# Patient Record
Sex: Male | Born: 1950 | Race: White | Hispanic: No | Marital: Married | Smoking: Former smoker
Health system: Southern US, Community
[De-identification: ages and names within clinical notes are randomized; demographics above are authoritative.]

## PROBLEM LIST (undated history)

## (undated) DIAGNOSIS — M549 Dorsalgia, unspecified: Secondary | ICD-10-CM

## (undated) DIAGNOSIS — I10 Essential (primary) hypertension: Secondary | ICD-10-CM

## (undated) DIAGNOSIS — R7303 Prediabetes: Secondary | ICD-10-CM

## (undated) DIAGNOSIS — C61 Malignant neoplasm of prostate: Secondary | ICD-10-CM

## (undated) DIAGNOSIS — E7800 Pure hypercholesterolemia, unspecified: Secondary | ICD-10-CM

## (undated) DIAGNOSIS — M1990 Unspecified osteoarthritis, unspecified site: Secondary | ICD-10-CM

## (undated) DIAGNOSIS — I6529 Occlusion and stenosis of unspecified carotid artery: Secondary | ICD-10-CM

## (undated) DIAGNOSIS — Z9889 Other specified postprocedural states: Secondary | ICD-10-CM

## (undated) DIAGNOSIS — M79601 Pain in right arm: Secondary | ICD-10-CM

## (undated) DIAGNOSIS — R112 Nausea with vomiting, unspecified: Secondary | ICD-10-CM

## (undated) DIAGNOSIS — H35719 Central serous chorioretinopathy, unspecified eye: Secondary | ICD-10-CM

## (undated) DIAGNOSIS — F320 Major depressive disorder, single episode, mild: Secondary | ICD-10-CM

## (undated) DIAGNOSIS — M542 Cervicalgia: Secondary | ICD-10-CM

## (undated) DIAGNOSIS — F32A Depression, unspecified: Secondary | ICD-10-CM

## (undated) HISTORY — DX: Cervicalgia: M54.2

## (undated) HISTORY — DX: Dorsalgia, unspecified: M54.9

## (undated) HISTORY — DX: Essential (primary) hypertension: I10

## (undated) HISTORY — DX: Pure hypercholesterolemia, unspecified: E78.00

## (undated) HISTORY — PX: PROSTATE SURGERY: SHX751

## (undated) HISTORY — DX: Depression, unspecified: F32.A

## (undated) HISTORY — DX: Pain in right arm: M79.601

## (undated) HISTORY — DX: Malignant neoplasm of prostate: C61

## (undated) HISTORY — DX: Occlusion and stenosis of unspecified carotid artery: I65.29

## (undated) HISTORY — DX: Major depressive disorder, single episode, mild: F32.0

---

## 1973-03-30 HISTORY — PX: FRACTURE SURGERY: SHX138

## 1997-10-11 ENCOUNTER — Observation Stay (HOSPITAL_COMMUNITY): Admission: RE | Admit: 1997-10-11 | Discharge: 1997-10-12 | Payer: Self-pay | Admitting: Specialist

## 2000-03-30 HISTORY — PX: HIP SURGERY: SHX245

## 2003-12-24 ENCOUNTER — Ambulatory Visit (HOSPITAL_COMMUNITY): Admission: RE | Admit: 2003-12-24 | Discharge: 2003-12-24 | Payer: Self-pay | Admitting: Gastroenterology

## 2012-03-30 HISTORY — PX: NECK SURGERY: SHX720

## 2012-08-01 ENCOUNTER — Ambulatory Visit (INDEPENDENT_AMBULATORY_CARE_PROVIDER_SITE_OTHER): Payer: BC Managed Care – PPO | Admitting: Neurology

## 2012-08-01 ENCOUNTER — Encounter: Payer: Self-pay | Admitting: Neurology

## 2012-08-01 VITALS — BP 154/90 | HR 70 | Ht 72.0 in | Wt 188.0 lb

## 2012-08-01 DIAGNOSIS — R29898 Other symptoms and signs involving the musculoskeletal system: Secondary | ICD-10-CM

## 2012-08-01 DIAGNOSIS — M549 Dorsalgia, unspecified: Secondary | ICD-10-CM

## 2012-08-01 DIAGNOSIS — M79609 Pain in unspecified limb: Secondary | ICD-10-CM

## 2012-08-01 DIAGNOSIS — F320 Major depressive disorder, single episode, mild: Secondary | ICD-10-CM | POA: Insufficient documentation

## 2012-08-01 DIAGNOSIS — M542 Cervicalgia: Secondary | ICD-10-CM | POA: Insufficient documentation

## 2012-08-01 DIAGNOSIS — C61 Malignant neoplasm of prostate: Secondary | ICD-10-CM

## 2012-08-01 DIAGNOSIS — M79601 Pain in right arm: Secondary | ICD-10-CM
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
## 2012-08-02 ENCOUNTER — Encounter: Payer: Self-pay | Admitting: Neurology

## 2012-08-29 ENCOUNTER — Encounter (INDEPENDENT_AMBULATORY_CARE_PROVIDER_SITE_OTHER): Payer: BC Managed Care – PPO

## 2012-08-29 ENCOUNTER — Telehealth: Payer: Self-pay | Admitting: Neurology

## 2012-08-29 ENCOUNTER — Ambulatory Visit (INDEPENDENT_AMBULATORY_CARE_PROVIDER_SITE_OTHER): Payer: BC Managed Care – PPO | Admitting: Neurology

## 2012-08-29 DIAGNOSIS — M5412 Radiculopathy, cervical region: Secondary | ICD-10-CM

## 2012-08-29 DIAGNOSIS — M549 Dorsalgia, unspecified: Secondary | ICD-10-CM

## 2012-08-29 DIAGNOSIS — M542 Cervicalgia: Secondary | ICD-10-CM

## 2012-08-29 DIAGNOSIS — M79609 Pain in unspecified limb: Secondary | ICD-10-CM

## 2012-08-29 DIAGNOSIS — R29898 Other symptoms and signs involving the musculoskeletal system: Secondary | ICD-10-CM

## 2012-08-29 DIAGNOSIS — Z0289 Encounter for other administrative examinations: Secondary | ICD-10-CM

## 2012-08-29 DIAGNOSIS — C61 Malignant neoplasm of prostate: Secondary | ICD-10-CM

## 2012-08-29 DIAGNOSIS — F320 Major depressive disorder, single episode, mild: Secondary | ICD-10-CM

## 2012-08-29 DIAGNOSIS — M79601 Pain in right arm: Secondary | ICD-10-CM

## 2012-08-29 NOTE — Procedures (Signed)
History of present illness: 62 years old right-handed Caucasian male, with two-month history of neck pain, radiating pain to his right shoulder, right lateral arm, noticed muscle atrophy of right triceps muscle, and shoulder muscles,  On examination: he has mild right shoulder extension weakness, there was noticeable muscle atrophy of right triceps, mild right shoulder winging, deep tendon reflexes were present and symmetric  Nerve conduction study: Bilateral median, ulnar sensory and motor responses were normal  Electromyography: Selected needle examination was performed at right upper extremity muscles, right cervical paraspinal muscles  Right triceps, increased insertion activity, 2 plus spontaneous activity, complex enlarged motor unit potential with decreased recruitment patterns  Right pronator teres: increases insertion activity, 2 plus spontaneous activity, complex enlarged motor unit potential with decreased recruitment patterns  Right biceps, normally insertion activity, no spontaneous activity, normal unit potential, with normal recruitment patterns  Right deltoid, supraspinatus: normal insertion activity, no spontaneous activity, normal unit potential, with normal recruitment patterns  Right teres major: normal insertion activity, no spontaneous activity, normal unit potential, with normal recruitment patterns  There was poor relaxation at right cervical paraspinal muscles, there was no  spontaneous activity.  In conclusion:   This is an abnormal study, there is electrodiagnostic evidence of active denervation involving right C6, C7 myotomes, suggestive of an active C6,7 cervical radiculopathy.

## 2012-08-29 NOTE — Telephone Encounter (Signed)
Called patient and spoke to him. Patient him sch. With Dr.Yan on 09/12/2012

## 2012-09-01 ENCOUNTER — Ambulatory Visit (INDEPENDENT_AMBULATORY_CARE_PROVIDER_SITE_OTHER): Payer: BC Managed Care – PPO

## 2012-09-01 DIAGNOSIS — R29898 Other symptoms and signs involving the musculoskeletal system: Secondary | ICD-10-CM

## 2012-09-01 DIAGNOSIS — M79609 Pain in unspecified limb: Secondary | ICD-10-CM

## 2012-09-01 DIAGNOSIS — C61 Malignant neoplasm of prostate: Secondary | ICD-10-CM

## 2012-09-01 DIAGNOSIS — F320 Major depressive disorder, single episode, mild: Secondary | ICD-10-CM

## 2012-09-01 DIAGNOSIS — M549 Dorsalgia, unspecified: Secondary | ICD-10-CM

## 2012-09-01 DIAGNOSIS — M542 Cervicalgia: Secondary | ICD-10-CM

## 2012-09-01 DIAGNOSIS — M79601 Pain in right arm: Secondary | ICD-10-CM

## 2012-09-05 NOTE — Progress Notes (Signed)
Quick Note:  I have called him and his wife, abnormal MR scan of the cervical spine showing prominent spondylitic changes from C3-C6 with disc osteophyte protrusion laterally at C4-5 and C5-6 likely involving the right C5 and C6 nerve roots. ______

## 2012-09-12 ENCOUNTER — Ambulatory Visit (INDEPENDENT_AMBULATORY_CARE_PROVIDER_SITE_OTHER): Payer: BC Managed Care – PPO | Admitting: Neurology

## 2012-09-12 ENCOUNTER — Encounter: Payer: Self-pay | Admitting: Neurology

## 2012-09-12 VITALS — BP 133/81 | HR 71 | Ht 70.0 in | Wt 187.0 lb

## 2012-09-12 DIAGNOSIS — M79601 Pain in right arm: Secondary | ICD-10-CM

## 2012-09-12 DIAGNOSIS — R29898 Other symptoms and signs involving the musculoskeletal system: Secondary | ICD-10-CM

## 2012-09-12 DIAGNOSIS — M79609 Pain in unspecified limb: Secondary | ICD-10-CM

## 2012-09-12 DIAGNOSIS — M5010 Cervical disc disorder with radiculopathy, unspecified cervical region: Secondary | ICD-10-CM | POA: Insufficient documentation

## 2012-09-12 DIAGNOSIS — M5412 Radiculopathy, cervical region: Secondary | ICD-10-CM
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
## 2012-09-18 ENCOUNTER — Encounter: Payer: Self-pay | Admitting: Neurology

## 2012-09-22 NOTE — Telephone Encounter (Signed)
A referral has been made to Dr. Verlee Rossetti office. They will contact you with an appointment. Thank you. Su Ley BS RN

## 2013-01-02 ENCOUNTER — Ambulatory Visit: Payer: BC Managed Care – PPO | Admitting: Neurology

## 2013-02-02 ENCOUNTER — Other Ambulatory Visit: Payer: Self-pay

## 2016-12-31 ENCOUNTER — Other Ambulatory Visit: Payer: Self-pay | Admitting: Family Medicine

## 2016-12-31 DIAGNOSIS — R1011 Right upper quadrant pain: Secondary | ICD-10-CM

## 2017-01-06 ENCOUNTER — Ambulatory Visit
Admission: RE | Admit: 2017-01-06 | Discharge: 2017-01-06 | Disposition: A | Discharge status: Home / Self Care | Payer: Self-pay | Source: Ambulatory Visit | Attending: Family Medicine | Admitting: Family Medicine

## 2017-01-06 DIAGNOSIS — R1011 Right upper quadrant pain: Secondary | ICD-10-CM

## 2018-11-09 IMAGING — US US ABDOMEN LIMITED
1 series · 14 of 25 positions shown · non-contrast
Comparison: None.

CLINICAL DATA: Abdominal pain for 1 week

EXAM:
ULTRASOUND ABDOMEN LIMITED RIGHT UPPER QUADRANT

[Series 1: us abdomen limited · 0.18mm/px · 14 of 44 slices shown]
[im 1/44]
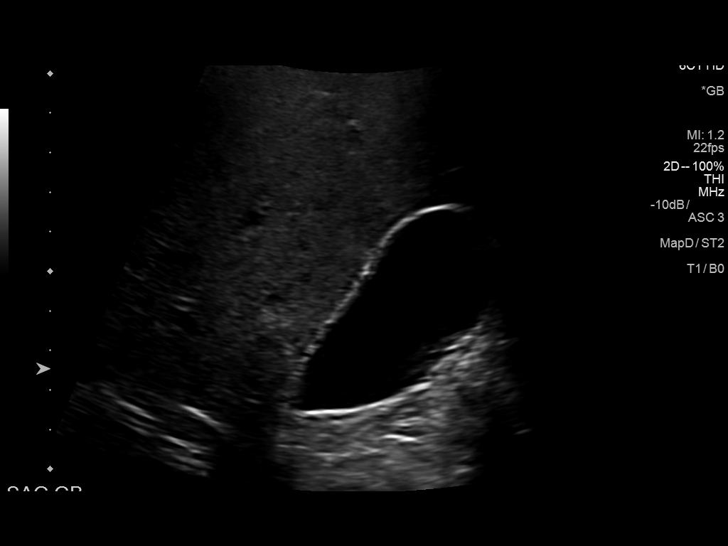
[im 4/44]
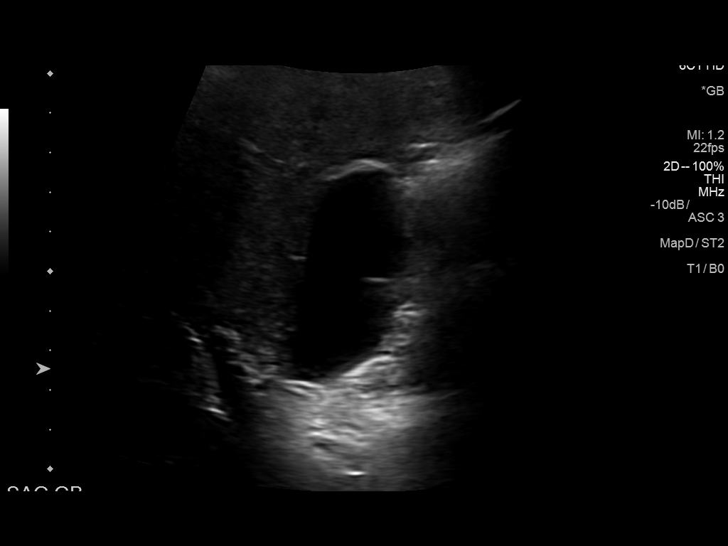
[im 8/44]
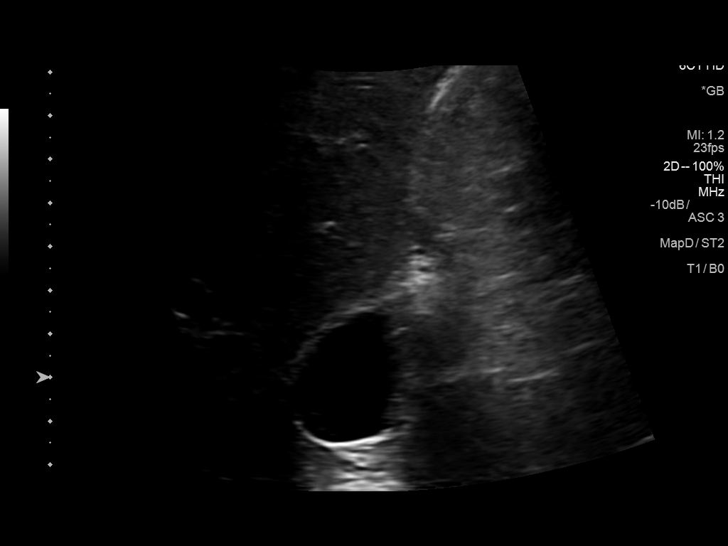
[im 11/44]
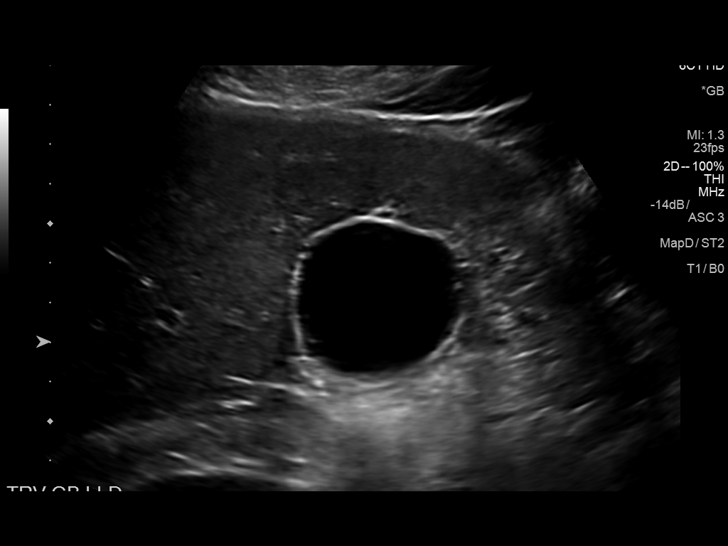
[im 15/44]
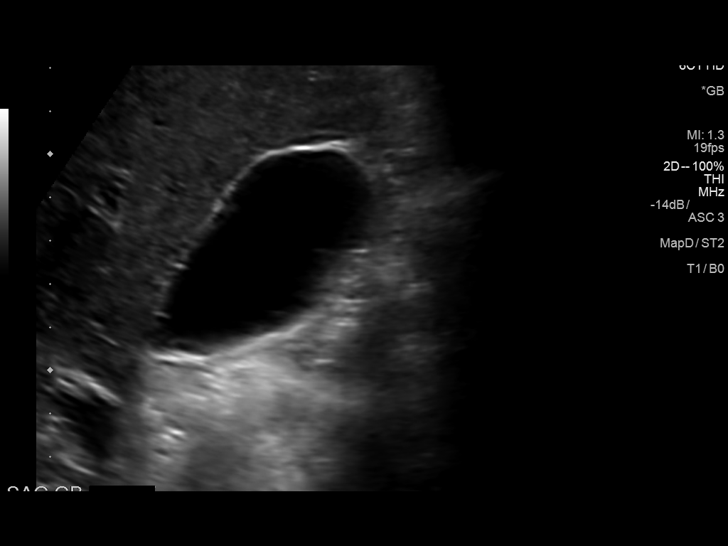
[im 17/44]
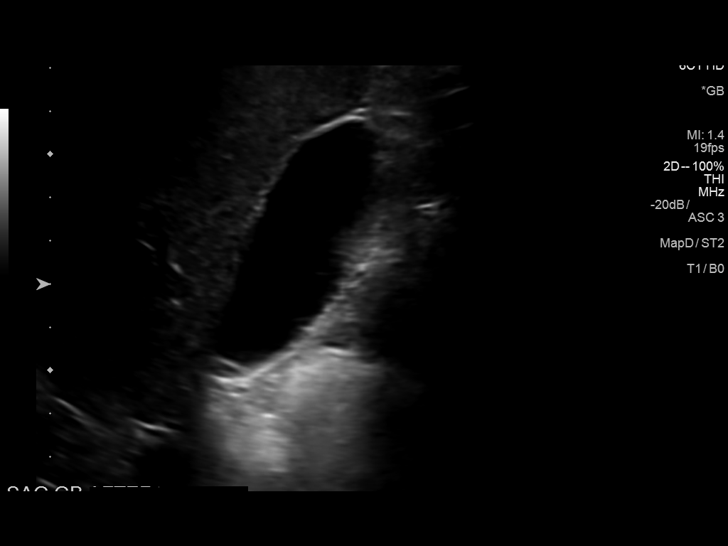
[im 20/44]
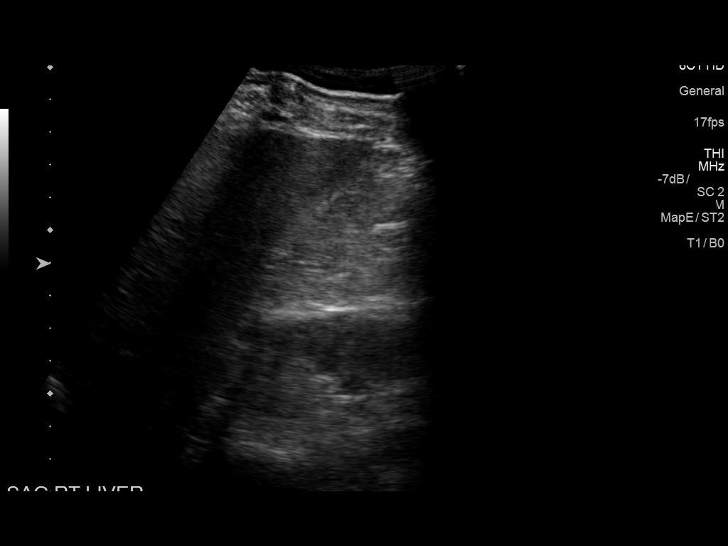
[im 24/44]
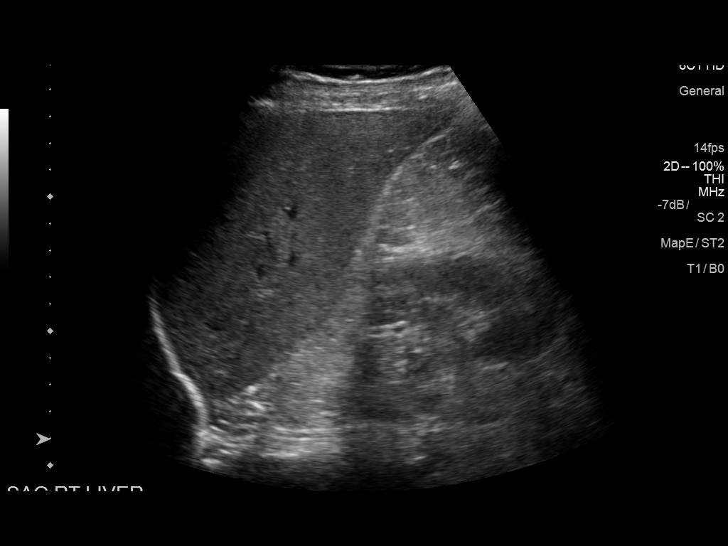
[im 27/44]
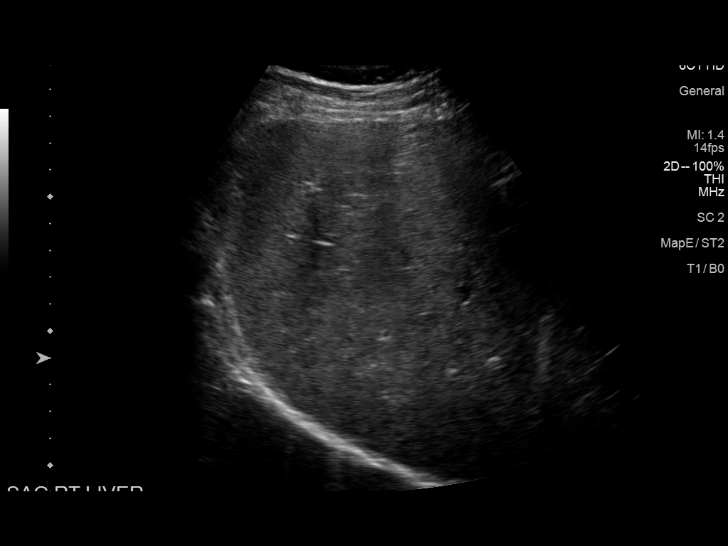
[im 29/44]
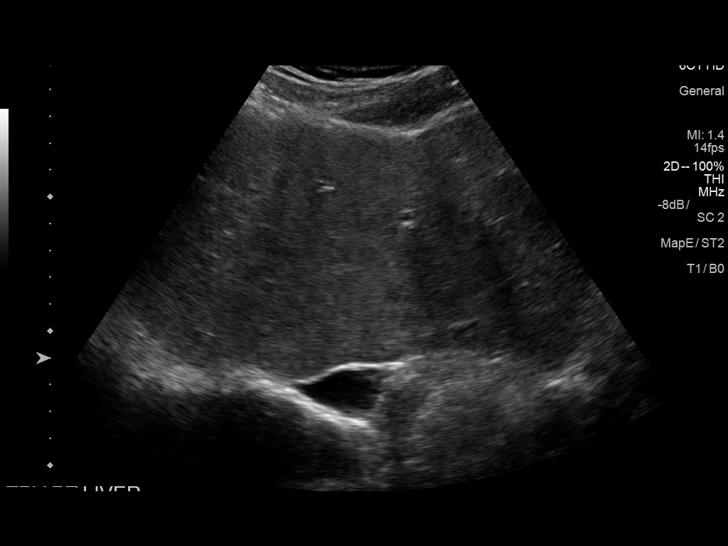
[im 33/44]
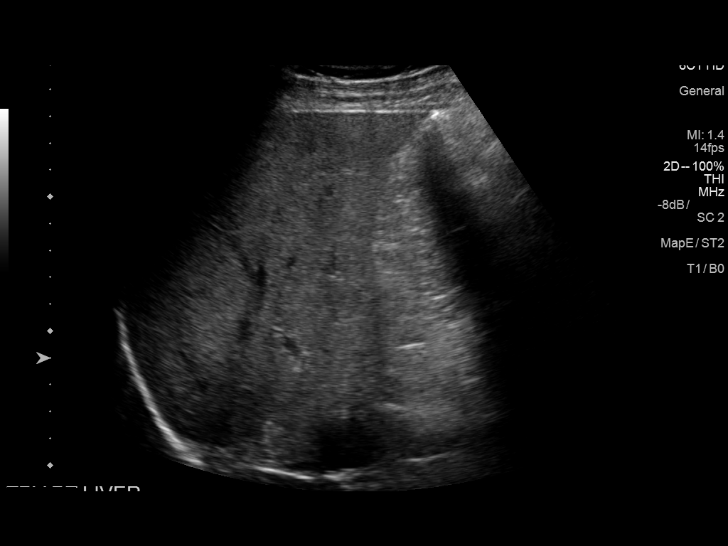
[im 36/44]
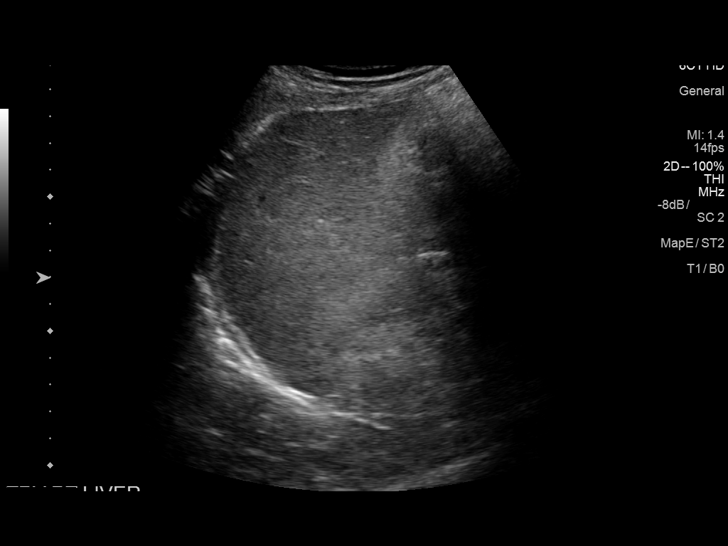
[im 40/44]
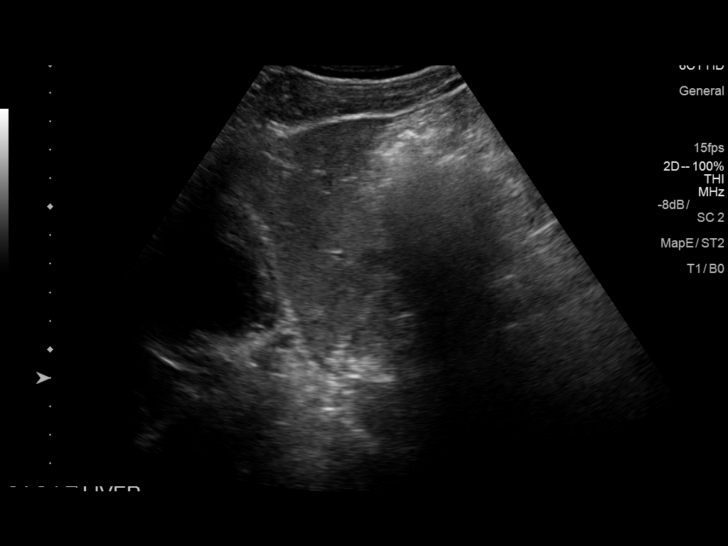
[im 44/44]
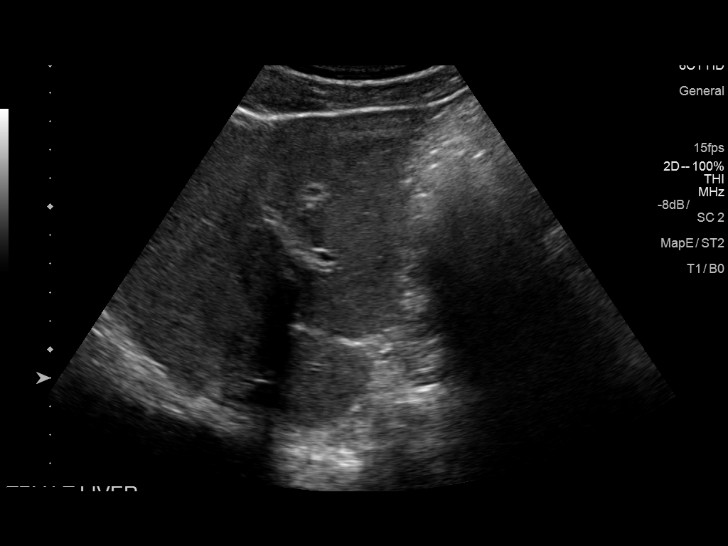

[14 of 25 positions shown; findings below may reference images not displayed]

FINDINGS: Gallbladder:

No gallstones or wall thickening visualized. No sonographic Murphy
sign noted by sonographer.

Common bile duct:

Diameter: 3.8 mm

Liver:

No focal lesion identified. Within normal limits in parenchymal
echogenicity. Portal vein is patent on color Doppler imaging with
normal direction of blood flow towards the liver.
IMPRESSION: Normal right upper quadrant ultrasound.

## 2019-05-08 ENCOUNTER — Ambulatory Visit: Payer: Self-pay | Attending: Internal Medicine

## 2019-05-08 DIAGNOSIS — Z23 Encounter for immunization: Secondary | ICD-10-CM | POA: Insufficient documentation

## 2019-05-08 NOTE — Progress Notes (Signed)
   Covid-19 Vaccination Clinic  Name:  Larry Holloway    MRN: [id number] DOB: 08/15/1950  05/08/2019  [name] was observed post Covid-19 immunization for 15 minutes without incidence. He was provided with Vaccine Information Sheet and instruction to access the V-Safe system.   [name] was instructed to call 911 with any severe reactions post vaccine: Marland Kitchen Difficulty breathing  . Swelling of your face and throat  . A fast heartbeat  . A bad rash all over your body  . Dizziness and weakness    Immunizations Administered    Name Date Dose VIS Date Route   Pfizer COVID-19 Vaccine 05/08/2019  6:20 PM 0.3 mL 03/10/2019 Intramuscular   Manufacturer: [employer]   Lot: [id number]   [place]: [id number]

## 2019-06-03 ENCOUNTER — Ambulatory Visit: Payer: Self-pay | Attending: Internal Medicine

## 2019-06-03 DIAGNOSIS — Z23 Encounter for immunization: Secondary | ICD-10-CM

## 2019-06-03 NOTE — Progress Notes (Signed)
   Covid-19 Vaccination Clinic  Name:  Larry Holloway    MRN: [id number] DOB: 1950-05-08  06/03/2019  [name] was observed post Covid-19 immunization for 15 minutes without incident. He was provided with Vaccine Information Sheet and instruction to access the V-Safe system.   [name] was instructed to call 911 with any severe reactions post vaccine: Marland Kitchen Difficulty breathing  . Swelling of face and throat  . A fast heartbeat  . A bad rash all over body  . Dizziness and weakness   Immunizations Administered    Name Date Dose VIS Date Route   Pfizer COVID-19 Vaccine 06/03/2019 12:01 PM 0.3 mL 03/10/2019 Intramuscular   Manufacturer: [place]   Lot: [id number]   [place]: [id number]

## 2019-09-04 DIAGNOSIS — Z8546 Personal history of malignant neoplasm of prostate: Secondary | ICD-10-CM | POA: Diagnosis not present

## 2019-09-04 DIAGNOSIS — I10 Essential (primary) hypertension: Secondary | ICD-10-CM | POA: Diagnosis not present

## 2019-09-04 DIAGNOSIS — E7800 Pure hypercholesterolemia, unspecified: Secondary | ICD-10-CM | POA: Diagnosis not present

## 2019-09-04 DIAGNOSIS — Z0000 Encounter for general adult medical examination without abnormal findings: Secondary | ICD-10-CM | POA: Diagnosis not present

## 2019-11-30 DIAGNOSIS — H9192 Unspecified hearing loss, left ear: Secondary | ICD-10-CM | POA: Diagnosis not present

## 2019-11-30 DIAGNOSIS — H6122 Impacted cerumen, left ear: Secondary | ICD-10-CM | POA: Diagnosis not present

## 2020-01-25 ENCOUNTER — Other Ambulatory Visit: Payer: Self-pay | Admitting: Physician Assistant

## 2020-01-25 DIAGNOSIS — H918X9 Other specified hearing loss, unspecified ear: Secondary | ICD-10-CM

## 2020-02-16 ENCOUNTER — Ambulatory Visit
Admission: RE | Admit: 2020-02-16 | Discharge: 2020-02-16 | Disposition: A | Discharge status: Home / Self Care | Payer: BC Managed Care – PPO | Source: Ambulatory Visit | Attending: Physician Assistant | Admitting: Physician Assistant

## 2020-02-16 ENCOUNTER — Other Ambulatory Visit: Payer: Self-pay

## 2020-02-16 DIAGNOSIS — H918X9 Other specified hearing loss, unspecified ear: Secondary | ICD-10-CM

## 2020-02-16 MED ORDER — GADOBENATE DIMEGLUMINE 529 MG/ML IV SOLN
15.0000 mL | Freq: Once | INTRAVENOUS | Status: AC | PRN
  Administered 2020-02-16: 15 mL via INTRAVENOUS

## 2020-03-30 HISTORY — PX: CATARACT EXTRACTION W/ INTRAOCULAR LENS IMPLANT: SHX1309

## 2020-05-17 DIAGNOSIS — H2512 Age-related nuclear cataract, left eye: Secondary | ICD-10-CM | POA: Diagnosis not present

## 2020-05-24 DIAGNOSIS — H25013 Cortical age-related cataract, bilateral: Secondary | ICD-10-CM | POA: Diagnosis not present

## 2020-09-02 DIAGNOSIS — I10 Essential (primary) hypertension: Secondary | ICD-10-CM | POA: Diagnosis not present

## 2020-09-02 DIAGNOSIS — Z79899 Other long term (current) drug therapy: Secondary | ICD-10-CM | POA: Diagnosis not present

## 2020-09-02 DIAGNOSIS — R7301 Impaired fasting glucose: Secondary | ICD-10-CM | POA: Diagnosis not present

## 2020-09-02 DIAGNOSIS — E7800 Pure hypercholesterolemia, unspecified: Secondary | ICD-10-CM | POA: Diagnosis not present

## 2020-09-02 DIAGNOSIS — Z8546 Personal history of malignant neoplasm of prostate: Secondary | ICD-10-CM | POA: Diagnosis not present

## 2020-09-09 DIAGNOSIS — E7800 Pure hypercholesterolemia, unspecified: Secondary | ICD-10-CM | POA: Diagnosis not present

## 2020-09-09 DIAGNOSIS — R7303 Prediabetes: Secondary | ICD-10-CM | POA: Diagnosis not present

## 2020-09-09 DIAGNOSIS — I10 Essential (primary) hypertension: Secondary | ICD-10-CM | POA: Diagnosis not present

## 2020-09-09 DIAGNOSIS — F341 Dysthymic disorder: Secondary | ICD-10-CM | POA: Diagnosis not present

## 2020-09-09 DIAGNOSIS — Z0000 Encounter for general adult medical examination without abnormal findings: Secondary | ICD-10-CM | POA: Diagnosis not present

## 2020-09-28 DIAGNOSIS — U071 COVID-19: Secondary | ICD-10-CM | POA: Diagnosis not present

## 2020-10-07 DIAGNOSIS — Z111 Encounter for screening for respiratory tuberculosis: Secondary | ICD-10-CM | POA: Diagnosis not present

## 2021-03-30 HISTORY — PX: COLONOSCOPY: SHX174

## 2021-06-13 DIAGNOSIS — Z8371 Family history of colonic polyps: Secondary | ICD-10-CM | POA: Diagnosis not present

## 2021-06-13 DIAGNOSIS — K648 Other hemorrhoids: Secondary | ICD-10-CM | POA: Diagnosis not present

## 2021-08-05 DIAGNOSIS — M65332 Trigger finger, left middle finger: Secondary | ICD-10-CM | POA: Diagnosis not present

## 2021-08-08 DIAGNOSIS — M65332 Trigger finger, left middle finger: Secondary | ICD-10-CM | POA: Diagnosis not present

## 2021-09-17 DIAGNOSIS — Z8546 Personal history of malignant neoplasm of prostate: Secondary | ICD-10-CM | POA: Diagnosis not present

## 2021-09-17 DIAGNOSIS — R7303 Prediabetes: Secondary | ICD-10-CM | POA: Diagnosis not present

## 2021-09-17 DIAGNOSIS — I10 Essential (primary) hypertension: Secondary | ICD-10-CM | POA: Diagnosis not present

## 2021-09-17 DIAGNOSIS — E7800 Pure hypercholesterolemia, unspecified: Secondary | ICD-10-CM | POA: Diagnosis not present

## 2021-09-19 DIAGNOSIS — R6889 Other general symptoms and signs: Secondary | ICD-10-CM | POA: Diagnosis not present

## 2021-09-19 DIAGNOSIS — M5000 Cervical disc disorder with myelopathy, unspecified cervical region: Secondary | ICD-10-CM | POA: Diagnosis not present

## 2021-09-19 DIAGNOSIS — M4316 Spondylolisthesis, lumbar region: Secondary | ICD-10-CM | POA: Diagnosis not present

## 2021-09-19 DIAGNOSIS — Z6826 Body mass index (BMI) 26.0-26.9, adult: Secondary | ICD-10-CM | POA: Diagnosis not present

## 2021-09-24 ENCOUNTER — Other Ambulatory Visit: Payer: Self-pay | Admitting: Family Medicine

## 2021-09-24 DIAGNOSIS — G2581 Restless legs syndrome: Secondary | ICD-10-CM | POA: Diagnosis not present

## 2021-09-24 DIAGNOSIS — R7303 Prediabetes: Secondary | ICD-10-CM | POA: Diagnosis not present

## 2021-09-24 DIAGNOSIS — Z0000 Encounter for general adult medical examination without abnormal findings: Secondary | ICD-10-CM | POA: Diagnosis not present

## 2021-09-24 DIAGNOSIS — F411 Generalized anxiety disorder: Secondary | ICD-10-CM | POA: Diagnosis not present

## 2021-10-06 ENCOUNTER — Ambulatory Visit
Admission: RE | Admit: 2021-10-06 | Discharge: 2021-10-06 | Disposition: A | Discharge status: Home / Self Care | Payer: Medicare Other | Source: Ambulatory Visit | Attending: Family Medicine | Admitting: Family Medicine

## 2021-10-06 DIAGNOSIS — Z0000 Encounter for general adult medical examination without abnormal findings: Secondary | ICD-10-CM

## 2021-10-06 DIAGNOSIS — Z87891 Personal history of nicotine dependence: Secondary | ICD-10-CM | POA: Diagnosis not present

## 2021-10-06 DIAGNOSIS — Z136 Encounter for screening for cardiovascular disorders: Secondary | ICD-10-CM | POA: Diagnosis not present

## 2021-10-07 DIAGNOSIS — M47816 Spondylosis without myelopathy or radiculopathy, lumbar region: Secondary | ICD-10-CM | POA: Diagnosis not present

## 2021-10-07 DIAGNOSIS — M2578 Osteophyte, vertebrae: Secondary | ICD-10-CM | POA: Diagnosis not present

## 2021-10-07 DIAGNOSIS — M5126 Other intervertebral disc displacement, lumbar region: Secondary | ICD-10-CM | POA: Diagnosis not present

## 2021-10-07 DIAGNOSIS — M5000 Cervical disc disorder with myelopathy, unspecified cervical region: Secondary | ICD-10-CM | POA: Diagnosis not present

## 2021-10-07 DIAGNOSIS — M5021 Other cervical disc displacement,  high cervical region: Secondary | ICD-10-CM | POA: Diagnosis not present

## 2021-10-07 DIAGNOSIS — M4316 Spondylolisthesis, lumbar region: Secondary | ICD-10-CM | POA: Diagnosis not present

## 2021-10-08 DIAGNOSIS — X32XXXA Exposure to sunlight, initial encounter: Secondary | ICD-10-CM | POA: Diagnosis not present

## 2021-10-08 DIAGNOSIS — D225 Melanocytic nevi of trunk: Secondary | ICD-10-CM | POA: Diagnosis not present

## 2021-10-08 DIAGNOSIS — L821 Other seborrheic keratosis: Secondary | ICD-10-CM | POA: Diagnosis not present

## 2021-10-08 DIAGNOSIS — L570 Actinic keratosis: Secondary | ICD-10-CM | POA: Diagnosis not present

## 2021-10-10 DIAGNOSIS — M4316 Spondylolisthesis, lumbar region: Secondary | ICD-10-CM | POA: Diagnosis not present

## 2021-10-10 DIAGNOSIS — Z6826 Body mass index (BMI) 26.0-26.9, adult: Secondary | ICD-10-CM | POA: Diagnosis not present

## 2021-10-28 DIAGNOSIS — M5116 Intervertebral disc disorders with radiculopathy, lumbar region: Secondary | ICD-10-CM | POA: Diagnosis not present

## 2021-10-28 DIAGNOSIS — M5416 Radiculopathy, lumbar region: Secondary | ICD-10-CM | POA: Diagnosis not present

## 2021-12-10 DIAGNOSIS — L03031 Cellulitis of right toe: Secondary | ICD-10-CM | POA: Diagnosis not present

## 2022-03-16 DIAGNOSIS — M5116 Intervertebral disc disorders with radiculopathy, lumbar region: Secondary | ICD-10-CM | POA: Diagnosis not present

## 2022-03-16 DIAGNOSIS — M5416 Radiculopathy, lumbar region: Secondary | ICD-10-CM | POA: Diagnosis not present

## 2022-04-01 DIAGNOSIS — K080 Exfoliation of teeth due to systemic causes: Secondary | ICD-10-CM | POA: Diagnosis not present

## 2022-04-07 DIAGNOSIS — K080 Exfoliation of teeth due to systemic causes: Secondary | ICD-10-CM | POA: Diagnosis not present

## 2022-04-24 DIAGNOSIS — F411 Generalized anxiety disorder: Secondary | ICD-10-CM | POA: Diagnosis not present

## 2022-04-24 DIAGNOSIS — M545 Low back pain, unspecified: Secondary | ICD-10-CM | POA: Diagnosis not present

## 2022-05-01 DIAGNOSIS — H524 Presbyopia: Secondary | ICD-10-CM | POA: Diagnosis not present

## 2022-05-05 DIAGNOSIS — K080 Exfoliation of teeth due to systemic causes: Secondary | ICD-10-CM | POA: Diagnosis not present

## 2022-05-13 DIAGNOSIS — M4316 Spondylolisthesis, lumbar region: Secondary | ICD-10-CM | POA: Diagnosis not present

## 2022-05-13 DIAGNOSIS — Z6825 Body mass index (BMI) 25.0-25.9, adult: Secondary | ICD-10-CM | POA: Diagnosis not present

## 2022-05-26 ENCOUNTER — Other Ambulatory Visit: Payer: Self-pay | Admitting: Neurological Surgery

## 2022-06-11 DIAGNOSIS — M5416 Radiculopathy, lumbar region: Secondary | ICD-10-CM | POA: Diagnosis not present

## 2022-06-11 DIAGNOSIS — M5116 Intervertebral disc disorders with radiculopathy, lumbar region: Secondary | ICD-10-CM | POA: Diagnosis not present

## 2022-07-15 DIAGNOSIS — M47816 Spondylosis without myelopathy or radiculopathy, lumbar region: Secondary | ICD-10-CM | POA: Diagnosis not present

## 2022-07-15 DIAGNOSIS — M25552 Pain in left hip: Secondary | ICD-10-CM | POA: Diagnosis not present

## 2022-07-15 DIAGNOSIS — M5136 Other intervertebral disc degeneration, lumbar region: Secondary | ICD-10-CM | POA: Diagnosis not present

## 2022-07-15 DIAGNOSIS — M5127 Other intervertebral disc displacement, lumbosacral region: Secondary | ICD-10-CM | POA: Diagnosis not present

## 2022-07-15 DIAGNOSIS — M4316 Spondylolisthesis, lumbar region: Secondary | ICD-10-CM | POA: Diagnosis not present

## 2022-07-15 DIAGNOSIS — Z6826 Body mass index (BMI) 26.0-26.9, adult: Secondary | ICD-10-CM | POA: Diagnosis not present

## 2022-07-17 DIAGNOSIS — M1612 Unilateral primary osteoarthritis, left hip: Secondary | ICD-10-CM | POA: Diagnosis not present

## 2022-07-22 DIAGNOSIS — M1612 Unilateral primary osteoarthritis, left hip: Secondary | ICD-10-CM | POA: Diagnosis not present

## 2022-07-31 NOTE — Pre-Procedure Instructions (Signed)
Surgical Instructions    Your procedure is scheduled on Aug 10, 2022.  Report to [facility] Main Entrance "A" at 5:30 A.M., then check in with the Admitting office.  Call this number if you have problems the morning of surgery:  [phone]  If you have any questions prior to your surgery date call [phone]: Open Monday-Friday 8am-4pm If you experience any cold or flu symptoms such as cough, fever, chills, shortness of breath, etc. between now and your scheduled surgery, please notify us at the above number.     Remember:  Do not eat after midnight the night before your surgery  You may drink clear liquids until 4:30 AM the morning of your surgery.   Clear liquids allowed are: Water, Non-Citrus Juices (without pulp), Carbonated Beverages, Clear Tea, Black Coffee Only (NO MILK, CREAM OR POWDERED CREAMER of any kind), and Gatorade.     Take these medicines the morning of surgery with A SIP OF WATER:  amLODipine (NORVASC)   DULoxetine (CYMBALTA)   loratadine (CLARITIN)   pramipexole (MIRAPEX)   ALPRAZolam (XANAX) - may take if needed    As of today, STOP taking any Aspirin (unless otherwise instructed by your surgeon) Aleve, Naproxen, Ibuprofen, Motrin, Advil, Goody's, BC's, all herbal medications, fish oil, and all vitamins.                     Do NOT Smoke (Tobacco/Vaping) for 24 hours prior to your procedure.  If you use a CPAP at night, you may bring your mask/headgear for your overnight stay.   Contacts, glasses, piercing's, hearing aid's, dentures or partials may not be worn into surgery, please bring cases for these belongings.    For patients admitted to the hospital, discharge time will be determined by your treatment team.   Patients discharged the day of surgery will not be allowed to drive home, and someone needs to stay with them for 24 hours.  SURGICAL WAITING ROOM VISITATION Patients having surgery or a procedure may have no more than 2 support people in the  waiting area - these visitors may rotate.   Children under the age of 17 must have an adult with them who is not the patient. If the patient needs to stay at the hospital during part of their recovery, the visitor guidelines for inpatient rooms apply. Pre-op nurse will coordinate an appropriate time for 1 support person to accompany patient in pre-op.  This support person may not rotate.   Please refer to the [facility] website for the visitor guidelines for Inpatients (after your surgery is over and you are in a regular room).    Special instructions:   [place]- Preparing For Surgery  Before surgery, you can play an important role. Because skin is not sterile, your skin needs to be as free of germs as possible. You can reduce the number of germs on your skin by washing with CHG (chlorahexidine gluconate) Soap before surgery.  CHG is an antiseptic cleaner which kills germs and bonds with the skin to continue killing germs even after washing.    Oral Hygiene is also important to reduce your risk of infection.  Remember - BRUSH YOUR TEETH THE MORNING OF SURGERY WITH YOUR REGULAR TOOTHPASTE  Please follow the instructions on the handout you received at your pre-admission appointment about preparing for your upcoming surgery using the CHG surgical soap.    Day of Surgery: Take a shower with CHG soap. Do not wear jewelry or makeup  Do not wear lotions, powders, perfumes/colognes, or deodorant. Do not shave 48 hours prior to surgery.  Men may shave face and neck. Do not bring valuables to the hospital.  [facility] is not responsible for any belongings or valuables. Do not wear nail polish, gel polish, artificial nails, or any other type of covering on natural nails (fingers and toes) If you have artificial nails or gel coating that need to be removed by a nail salon, please have this removed prior to surgery. Artificial nails or gel coating may interfere with anesthesia's ability to  adequately monitor your vital signs. Wear Clean/Comfortable clothing the morning of surgery Remember to brush your teeth WITH YOUR REGULAR TOOTHPASTE.   Please read over the following fact sheets that you were given.    If you received a COVID test during your pre-op visit  it is requested that you wear a mask when out in public, stay away from anyone that may not be feeling well and notify your surgeon if you develop symptoms. If you have been in contact with anyone that has tested positive in the last 10 days please notify you surgeon.

## 2022-08-03 ENCOUNTER — Encounter (HOSPITAL_COMMUNITY)
Admission: RE | Admit: 2022-08-03 | Discharge: 2022-08-03 | Disposition: A | Discharge status: Home / Self Care | Payer: Medicare Other | Source: Ambulatory Visit | Attending: Neurological Surgery | Admitting: Neurological Surgery

## 2022-08-03 ENCOUNTER — Other Ambulatory Visit: Payer: Self-pay

## 2022-08-03 ENCOUNTER — Encounter (HOSPITAL_COMMUNITY): Payer: Self-pay

## 2022-08-03 VITALS — BP 154/85 | HR 85 | Temp 98.2°F | Resp 18 | Ht 71.0 in | Wt 195.4 lb

## 2022-08-03 DIAGNOSIS — Z01818 Encounter for other preprocedural examination: Secondary | ICD-10-CM | POA: Insufficient documentation

## 2022-08-03 DIAGNOSIS — I2510 Atherosclerotic heart disease of native coronary artery without angina pectoris: Secondary | ICD-10-CM | POA: Insufficient documentation

## 2022-08-03 HISTORY — DX: Prediabetes: R73.03

## 2022-08-03 HISTORY — DX: Central serous chorioretinopathy, unspecified eye: H35.719

## 2022-08-03 HISTORY — DX: Other specified postprocedural states: Z98.890

## 2022-08-03 HISTORY — DX: Unspecified osteoarthritis, unspecified site: M19.90

## 2022-08-03 HISTORY — DX: Nausea with vomiting, unspecified: R11.2

## 2022-08-03 LAB — CBC
HCT: 39.7 % (ref 39.0–52.0)
Hemoglobin: 13.3 g/dL (ref 13.0–17.0)
MCH: 31.7 pg (ref 26.0–34.0)
MCHC: 33.5 g/dL (ref 30.0–36.0)
MCV: 94.7 fL (ref 80.0–100.0)
Platelets: 232 10*3/uL (ref 150–400)
RBC: 4.19 MIL/uL — ABNORMAL LOW (ref 4.22–5.81)
RDW: 14.8 % (ref 11.5–15.5)
WBC: 8.8 10*3/uL (ref 4.0–10.5)
nRBC: 0 % (ref 0.0–0.2)

## 2022-08-03 LAB — BASIC METABOLIC PANEL
Anion gap: 10 (ref 5–15)
BUN: 15 mg/dL (ref 8–23)
CO2: 22 mmol/L (ref 22–32)
Calcium: 9 mg/dL (ref 8.9–10.3)
Chloride: 100 mmol/L (ref 98–111)
Creatinine, Ser: 0.85 mg/dL (ref 0.61–1.24)
GFR, Estimated: >60 mL/min (ref >60)
Glucose, Bld: 97 mg/dL (ref 70–99)
Potassium: 4.3 mmol/L (ref 3.5–5.1)
Sodium: 132 mmol/L — ABNORMAL LOW (ref 135–145)

## 2022-08-03 LAB — TYPE AND SCREEN
ABO/RH(D): O NEG
Antibody Screen: NEGATIVE

## 2022-08-03 LAB — SURGICAL PCR SCREEN
MRSA, PCR: NEGATIVE
Staphylococcus aureus: POSITIVE — AB

## 2022-08-03 NOTE — Progress Notes (Signed)
PCP - Dr. Daisy Floro Cardiologist - Denies  PPM/ICD - Denies Device Orders - n/a Rep Notified - n/a  Chest x-ray - n/a EKG - 08/03/2022 Stress Test - Denies ECHO - Denies Cardiac Cath - Denies  Sleep Study - Denies CPAP - n/a  Pt is Pre-DM  Last dose of GLP1 agonist- n/a GLP1 instructions: n/a  Blood Thinner Instructions: n/a Aspirin Instructions: n/a  ERAS Protcol - Clear liquids until 0430 morning of surgery PRE-SURGERY Ensure or G2- n/a  COVID TEST- n/a   Anesthesia review: Yes. EKG review   Patient denies shortness of breath, fever, cough and chest pain at PAT appointment. Pt denies any respiratory illness/infection in the last two months.   All instructions explained to the patient, with a verbal understanding of the material. Patient agrees to go over the instructions while at home for a better understanding. Patient also instructed to self quarantine after being tested for COVID-19. The opportunity to ask questions was provided.

## 2022-08-09 NOTE — Anesthesia Preprocedure Evaluation (Signed)
Anesthesia Evaluation  Patient identified by MRN, date of birth, ID band Patient awake    Reviewed: Allergy & Precautions, NPO status , Patient's Chart, lab work & pertinent test results  History of Anesthesia Complications (+) PONV and history of anesthetic complications  Airway Mallampati: I  TM Distance: >3 FB Neck ROM: Full    Dental  (+) Dental Advisory Given   Pulmonary neg shortness of breath, neg sleep apnea, neg COPD, neg recent URI, former smoker   Pulmonary exam normal breath sounds clear to auscultation       Cardiovascular hypertension (amlodipine), Pt. on medications (-) angina (-) Past MI, (-) Cardiac Stents and (-) CABG + dysrhythmias (incomplete RBBB)  Rhythm:Regular Rate:Normal  HLD   Neuro/Psych neg Seizures PSYCHIATRIC DISORDERS  Depression     Neuromuscular disease (cervical radiculopathy, lumbar spondylolisthesis)    GI/Hepatic Neg liver ROS,GERD  Medicated,,  Endo/Other  Pre-diabetes  Renal/GU negative Renal ROS     Musculoskeletal  (+) Arthritis ,    Abdominal   Peds  Hematology negative hematology ROS (+)   Anesthesia Other Findings   Reproductive/Obstetrics                             Anesthesia Physical Anesthesia Plan  ASA: 2  Anesthesia Plan: General   Post-op Pain Management: Tylenol PO (pre-op)*   Induction: Intravenous  PONV Risk Score and Plan: 3 and Ondansetron, Dexamethasone and Treatment may vary due to age or medical condition  Airway Management Planned: Oral ETT  Additional Equipment:   Intra-op Plan:   Post-operative Plan: Extubation in OR  Informed Consent: I have reviewed the patients History and Physical, chart, labs and discussed the procedure including the risks, benefits and alternatives for the proposed anesthesia with the patient or authorized representative who has indicated his/her understanding and acceptance.     Dental  advisory given  Plan Discussed with: CRNA and Anesthesiologist  Anesthesia Plan Comments: (Risks of general anesthesia discussed including, but not limited to, sore throat, hoarse voice, chipped/damaged teeth, injury to vocal cords, nausea and vomiting, allergic reactions, lung infection, heart attack, stroke, and death. All questions answered. )       Anesthesia Quick Evaluation

## 2022-08-10 ENCOUNTER — Other Ambulatory Visit: Payer: Self-pay

## 2022-08-10 ENCOUNTER — Encounter (HOSPITAL_COMMUNITY): Payer: Self-pay | Admitting: Neurological Surgery

## 2022-08-10 ENCOUNTER — Inpatient Hospital Stay (HOSPITAL_COMMUNITY)
Admission: RE | Admit: 2022-08-10 | Discharge: 2022-08-17 | DRG: 455 | Disposition: A | Discharge status: Home Health | Payer: Medicare Other | Attending: Neurological Surgery | Admitting: Neurological Surgery

## 2022-08-10 ENCOUNTER — Inpatient Hospital Stay (HOSPITAL_COMMUNITY): Payer: Medicare Other | Admitting: Anesthesiology

## 2022-08-10 ENCOUNTER — Inpatient Hospital Stay (HOSPITAL_COMMUNITY): Payer: Medicare Other

## 2022-08-10 ENCOUNTER — Encounter (HOSPITAL_COMMUNITY)
Admission: RE | Disposition: A | Discharge status: Home Health | Payer: Self-pay | Source: Home / Self Care | Attending: Neurological Surgery

## 2022-08-10 DIAGNOSIS — Z4789 Encounter for other orthopedic aftercare: Secondary | ICD-10-CM | POA: Diagnosis not present

## 2022-08-10 DIAGNOSIS — M48062 Spinal stenosis, lumbar region with neurogenic claudication: Secondary | ICD-10-CM | POA: Diagnosis not present

## 2022-08-10 DIAGNOSIS — M4807 Spinal stenosis, lumbosacral region: Secondary | ICD-10-CM | POA: Diagnosis not present

## 2022-08-10 DIAGNOSIS — M5416 Radiculopathy, lumbar region: Secondary | ICD-10-CM | POA: Diagnosis not present

## 2022-08-10 DIAGNOSIS — M4156 Other secondary scoliosis, lumbar region: Secondary | ICD-10-CM | POA: Diagnosis present

## 2022-08-10 DIAGNOSIS — Z79899 Other long term (current) drug therapy: Secondary | ICD-10-CM | POA: Diagnosis not present

## 2022-08-10 DIAGNOSIS — R7303 Prediabetes: Secondary | ICD-10-CM | POA: Diagnosis not present

## 2022-08-10 DIAGNOSIS — M4316 Spondylolisthesis, lumbar region: Secondary | ICD-10-CM | POA: Diagnosis not present

## 2022-08-10 DIAGNOSIS — I10 Essential (primary) hypertension: Secondary | ICD-10-CM | POA: Diagnosis not present

## 2022-08-10 DIAGNOSIS — R2681 Unsteadiness on feet: Secondary | ICD-10-CM | POA: Diagnosis not present

## 2022-08-10 DIAGNOSIS — Z961 Presence of intraocular lens: Secondary | ICD-10-CM | POA: Diagnosis not present

## 2022-08-10 DIAGNOSIS — M4186 Other forms of scoliosis, lumbar region: Secondary | ICD-10-CM

## 2022-08-10 DIAGNOSIS — Z91048 Other nonmedicinal substance allergy status: Secondary | ICD-10-CM

## 2022-08-10 DIAGNOSIS — Z981 Arthrodesis status: Secondary | ICD-10-CM | POA: Diagnosis not present

## 2022-08-10 DIAGNOSIS — Z8546 Personal history of malignant neoplasm of prostate: Secondary | ICD-10-CM

## 2022-08-10 DIAGNOSIS — Z9841 Cataract extraction status, right eye: Secondary | ICD-10-CM

## 2022-08-10 DIAGNOSIS — E7800 Pure hypercholesterolemia, unspecified: Secondary | ICD-10-CM | POA: Diagnosis not present

## 2022-08-10 DIAGNOSIS — M4726 Other spondylosis with radiculopathy, lumbar region: Secondary | ICD-10-CM | POA: Diagnosis not present

## 2022-08-10 DIAGNOSIS — Z8042 Family history of malignant neoplasm of prostate: Secondary | ICD-10-CM | POA: Diagnosis not present

## 2022-08-10 DIAGNOSIS — M5417 Radiculopathy, lumbosacral region: Secondary | ICD-10-CM

## 2022-08-10 DIAGNOSIS — Z9842 Cataract extraction status, left eye: Secondary | ICD-10-CM | POA: Diagnosis not present

## 2022-08-10 DIAGNOSIS — Z87891 Personal history of nicotine dependence: Secondary | ICD-10-CM

## 2022-08-10 DIAGNOSIS — M1990 Unspecified osteoarthritis, unspecified site: Secondary | ICD-10-CM | POA: Diagnosis not present

## 2022-08-10 DIAGNOSIS — G2581 Restless legs syndrome: Secondary | ICD-10-CM | POA: Diagnosis not present

## 2022-08-10 DIAGNOSIS — M48061 Spinal stenosis, lumbar region without neurogenic claudication: Secondary | ICD-10-CM | POA: Diagnosis not present

## 2022-08-10 HISTORY — PX: ANTERIOR LAT LUMBAR FUSION: SHX1168

## 2022-08-10 LAB — ABO/RH: ABO/RH(D): O NEG

## 2022-08-10 SURGERY — ANTERIOR LATERAL LUMBAR FUSION 3 LEVELS
Anesthesia: General

## 2022-08-10 MED ORDER — PROPOFOL 1000 MG/100ML IV EMUL
INTRAVENOUS | Status: AC
  Filled 2022-08-10: qty 100

## 2022-08-10 MED ORDER — KETOROLAC TROMETHAMINE 0.5 % OP SOLN
OPHTHALMIC | Status: AC
  Filled 2022-08-10: qty 5

## 2022-08-10 MED ORDER — LIDOCAINE-EPINEPHRINE 1 %-1:100000 IJ SOLN
INTRAMUSCULAR | Status: AC
  Filled 2022-08-10: qty 1

## 2022-08-10 MED ORDER — ACETAMINOPHEN 500 MG PO TABS
1000.0000 mg | ORAL_TABLET | Freq: Once | ORAL | Status: AC
  Administered 2022-08-10: 1000 mg via ORAL
  Filled 2022-08-10: qty 2

## 2022-08-10 MED ORDER — CHLORHEXIDINE GLUCONATE CLOTH 2 % EX PADS: 6.0000 | MEDICATED_PAD | Freq: Once | CUTANEOUS | Status: DC

## 2022-08-10 MED ORDER — LIDOCAINE-EPINEPHRINE 1 %-1:100000 IJ SOLN
INTRAMUSCULAR | Status: DC | PRN
  Administered 2022-08-10: 8 mL

## 2022-08-10 MED ORDER — CEFAZOLIN SODIUM-DEXTROSE 2-4 GM/100ML-% IV SOLN
2.0000 g | Freq: Three times a day (TID) | INTRAVENOUS | Status: AC
  Administered 2022-08-10 – 2022-08-11 (×2): 2 g via INTRAVENOUS
  Filled 2022-08-10 (×2): qty 100

## 2022-08-10 MED ORDER — ORAL CARE MOUTH RINSE: 15.0000 mL | Freq: Once | OROMUCOSAL | Status: AC

## 2022-08-10 MED ORDER — PHENYLEPHRINE HCL-NACL 20-0.9 MG/250ML-% IV SOLN
INTRAVENOUS | Status: DC | PRN
  Administered 2022-08-10: 15 ug/min via INTRAVENOUS

## 2022-08-10 MED ORDER — PROPOFOL 10 MG/ML IV BOLUS
INTRAVENOUS | Status: DC | PRN
  Administered 2022-08-10: 200 mg via INTRAVENOUS

## 2022-08-10 MED ORDER — FLEET ENEMA 7-19 GM/118ML RE ENEM: 1.0000 | ENEMA | Freq: Once | RECTAL | Status: DC | PRN

## 2022-08-10 MED ORDER — ACETAMINOPHEN 650 MG RE SUPP: 650.0000 mg | RECTAL | Status: DC | PRN

## 2022-08-10 MED ORDER — PROPOFOL 10 MG/ML IV BOLUS
INTRAVENOUS | Status: AC
  Filled 2022-08-10: qty 20

## 2022-08-10 MED ORDER — ONDANSETRON HCL 4 MG/2ML IJ SOLN
INTRAMUSCULAR | Status: AC
  Filled 2022-08-10: qty 2

## 2022-08-10 MED ORDER — FENTANYL CITRATE (PF) 250 MCG/5ML IJ SOLN
INTRAMUSCULAR | Status: AC
  Filled 2022-08-10: qty 5

## 2022-08-10 MED ORDER — LIDOCAINE 2% (20 MG/ML) 5 ML SYRINGE
INTRAMUSCULAR | Status: DC | PRN
  Administered 2022-08-10: 80 mg via INTRAVENOUS

## 2022-08-10 MED ORDER — BUPIVACAINE HCL (PF) 0.5 % IJ SOLN
INTRAMUSCULAR | Status: AC
  Filled 2022-08-10: qty 30

## 2022-08-10 MED ORDER — FENTANYL CITRATE (PF) 250 MCG/5ML IJ SOLN
INTRAMUSCULAR | Status: DC | PRN
  Administered 2022-08-10: 100 ug via INTRAVENOUS
  Administered 2022-08-10 (×5): 50 ug via INTRAVENOUS

## 2022-08-10 MED ORDER — ROCURONIUM BROMIDE 10 MG/ML (PF) SYRINGE
PREFILLED_SYRINGE | INTRAVENOUS | Status: AC
  Filled 2022-08-10: qty 10

## 2022-08-10 MED ORDER — DEXAMETHASONE SODIUM PHOSPHATE 10 MG/ML IJ SOLN
INTRAMUSCULAR | Status: DC | PRN
  Administered 2022-08-10: 10 mg via INTRAVENOUS

## 2022-08-10 MED ORDER — LORATADINE 10 MG PO TABS
10.0000 mg | ORAL_TABLET | Freq: Every day | ORAL | Status: DC
  Administered 2022-08-11 – 2022-08-17 (×7): 10 mg via ORAL
  Filled 2022-08-10 (×7): qty 1

## 2022-08-10 MED ORDER — ONDANSETRON HCL 4 MG/2ML IJ SOLN
INTRAMUSCULAR | Status: DC | PRN
  Administered 2022-08-10: 4 mg via INTRAVENOUS

## 2022-08-10 MED ORDER — L-METHYLFOLATE-B6-B12 3-35-2 MG PO TABS
1.0000 | ORAL_TABLET | Freq: Every day | ORAL | Status: DC
  Administered 2022-08-11 – 2022-08-17 (×6): 1 via ORAL
  Filled 2022-08-10 (×7): qty 1

## 2022-08-10 MED ORDER — FENTANYL CITRATE (PF) 100 MCG/2ML IJ SOLN: 25.0000 ug | INTRAMUSCULAR | Status: DC | PRN

## 2022-08-10 MED ORDER — MORPHINE SULFATE (PF) 2 MG/ML IV SOLN
2.0000 mg | INTRAVENOUS | Status: DC | PRN
  Administered 2022-08-13: 2 mg via INTRAVENOUS
  Filled 2022-08-10: qty 1

## 2022-08-10 MED ORDER — PANTOPRAZOLE SODIUM 40 MG PO TBEC
40.0000 mg | DELAYED_RELEASE_TABLET | Freq: Every day | ORAL | Status: DC
  Administered 2022-08-11 – 2022-08-17 (×6): 40 mg via ORAL
  Filled 2022-08-10 (×6): qty 1

## 2022-08-10 MED ORDER — SUCCINYLCHOLINE CHLORIDE 200 MG/10ML IV SOSY
PREFILLED_SYRINGE | INTRAVENOUS | Status: DC | PRN
  Administered 2022-08-10: 100 mg via INTRAVENOUS

## 2022-08-10 MED ORDER — ACETAMINOPHEN 325 MG PO TABS: 650.0000 mg | ORAL_TABLET | ORAL | Status: DC | PRN

## 2022-08-10 MED ORDER — AMISULPRIDE (ANTIEMETIC) 5 MG/2ML IV SOLN: 10.0000 mg | Freq: Once | INTRAVENOUS | Status: DC | PRN

## 2022-08-10 MED ORDER — SODIUM CHLORIDE 0.9% FLUSH
3.0000 mL | Freq: Two times a day (BID) | INTRAVENOUS | Status: DC
  Administered 2022-08-10 – 2022-08-12 (×6): 3 mL via INTRAVENOUS

## 2022-08-10 MED ORDER — LACTATED RINGERS IV SOLN: INTRAVENOUS | Status: DC | PRN

## 2022-08-10 MED ORDER — OXYCODONE-ACETAMINOPHEN 5-325 MG PO TABS
1.0000 | ORAL_TABLET | ORAL | Status: DC | PRN
  Administered 2022-08-10 – 2022-08-13 (×15): 2 via ORAL
  Administered 2022-08-14 (×2): 1 via ORAL
  Administered 2022-08-14 – 2022-08-15 (×2): 2 via ORAL
  Administered 2022-08-16 (×2): 1 via ORAL
  Administered 2022-08-16 – 2022-08-17 (×2): 2 via ORAL
  Filled 2022-08-10 (×2): qty 2
  Filled 2022-08-10 (×2): qty 1
  Filled 2022-08-10 (×10): qty 2
  Filled 2022-08-10: qty 1
  Filled 2022-08-10 (×4): qty 2
  Filled 2022-08-10: qty 1
  Filled 2022-08-10 (×4): qty 2

## 2022-08-10 MED ORDER — METHOCARBAMOL 1000 MG/10ML IJ SOLN: 500.0000 mg | Freq: Four times a day (QID) | INTRAVENOUS | Status: DC | PRN

## 2022-08-10 MED ORDER — DEXAMETHASONE SODIUM PHOSPHATE 10 MG/ML IJ SOLN
INTRAMUSCULAR | Status: AC
  Filled 2022-08-10: qty 1

## 2022-08-10 MED ORDER — OXYCODONE HCL 5 MG/5ML PO SOLN: 5.0000 mg | Freq: Once | ORAL | Status: DC | PRN

## 2022-08-10 MED ORDER — LABETALOL HCL 5 MG/ML IV SOLN
INTRAVENOUS | Status: AC
  Filled 2022-08-10: qty 4

## 2022-08-10 MED ORDER — CEFAZOLIN SODIUM 1 G IJ SOLR
INTRAMUSCULAR | Status: AC
  Filled 2022-08-10: qty 20

## 2022-08-10 MED ORDER — LACTATED RINGERS IV SOLN: INTRAVENOUS | Status: DC

## 2022-08-10 MED ORDER — SODIUM CHLORIDE 0.9% FLUSH: 3.0000 mL | INTRAVENOUS | Status: DC | PRN

## 2022-08-10 MED ORDER — PROPOFOL 500 MG/50ML IV EMUL
INTRAVENOUS | Status: DC | PRN
  Administered 2022-08-10: 50 ug/kg/min via INTRAVENOUS
  Administered 2022-08-10: 75 ug/kg/min via INTRAVENOUS

## 2022-08-10 MED ORDER — 0.9 % SODIUM CHLORIDE (POUR BTL) OPTIME
TOPICAL | Status: DC | PRN
  Administered 2022-08-10 (×2): 1000 mL

## 2022-08-10 MED ORDER — BUPIVACAINE HCL (PF) 0.5 % IJ SOLN
INTRAMUSCULAR | Status: DC | PRN
  Administered 2022-08-10: 8 mL

## 2022-08-10 MED ORDER — MENTHOL 3 MG MT LOZG: 1.0000 | LOZENGE | OROMUCOSAL | Status: DC | PRN

## 2022-08-10 MED ORDER — SODIUM CHLORIDE 0.9 % IV SOLN: 250.0000 mL | INTRAVENOUS | Status: DC

## 2022-08-10 MED ORDER — AMLODIPINE BESYLATE 5 MG PO TABS
7.5000 mg | ORAL_TABLET | Freq: Every day | ORAL | Status: DC
  Administered 2022-08-11 – 2022-08-17 (×7): 7.5 mg via ORAL
  Filled 2022-08-10 (×8): qty 1

## 2022-08-10 MED ORDER — BISACODYL 5 MG PO TBEC
5.0000 mg | DELAYED_RELEASE_TABLET | Freq: Every day | ORAL | Status: DC | PRN
  Administered 2022-08-12: 5 mg via ORAL
  Filled 2022-08-10: qty 1

## 2022-08-10 MED ORDER — THROMBIN 5000 UNITS EX SOLR
CUTANEOUS | Status: AC
  Filled 2022-08-10: qty 5000

## 2022-08-10 MED ORDER — DULOXETINE HCL 60 MG PO CPEP
60.0000 mg | ORAL_CAPSULE | Freq: Every day | ORAL | Status: DC
  Administered 2022-08-11 – 2022-08-17 (×7): 60 mg via ORAL
  Filled 2022-08-10 (×4): qty 1
  Filled 2022-08-10: qty 2
  Filled 2022-08-10: qty 1
  Filled 2022-08-10: qty 2
  Filled 2022-08-10 (×2): qty 1
  Filled 2022-08-10: qty 2

## 2022-08-10 MED ORDER — EPHEDRINE SULFATE-NACL 50-0.9 MG/10ML-% IV SOSY
PREFILLED_SYRINGE | INTRAVENOUS | Status: DC | PRN
  Administered 2022-08-10: 5 mg via INTRAVENOUS

## 2022-08-10 MED ORDER — METHOCARBAMOL 500 MG PO TABS
500.0000 mg | ORAL_TABLET | Freq: Four times a day (QID) | ORAL | Status: DC | PRN
  Administered 2022-08-10 – 2022-08-16 (×15): 500 mg via ORAL
  Filled 2022-08-10 (×15): qty 1

## 2022-08-10 MED ORDER — POLYETHYLENE GLYCOL 3350 17 G PO PACK: 17.0000 g | PACK | Freq: Every day | ORAL | Status: DC | PRN

## 2022-08-10 MED ORDER — ONDANSETRON HCL 4 MG/2ML IJ SOLN: 4.0000 mg | Freq: Four times a day (QID) | INTRAMUSCULAR | Status: DC | PRN

## 2022-08-10 MED ORDER — BISACODYL 10 MG RE SUPP: 10.0000 mg | Freq: Every day | RECTAL | Status: DC | PRN

## 2022-08-10 MED ORDER — VITAMIN D 25 MCG (1000 UNIT) PO TABS
1000.0000 [IU] | ORAL_TABLET | Freq: Every day | ORAL | Status: DC
  Administered 2022-08-10 – 2022-08-17 (×7): 1000 [IU] via ORAL
  Filled 2022-08-10 (×7): qty 1

## 2022-08-10 MED ORDER — PRAMIPEXOLE DIHYDROCHLORIDE 0.25 MG PO TABS
0.5000 mg | ORAL_TABLET | Freq: Two times a day (BID) | ORAL | Status: DC
  Administered 2022-08-11 – 2022-08-17 (×10): 0.5 mg via ORAL
  Filled 2022-08-10 (×12): qty 2

## 2022-08-10 MED ORDER — KETOROLAC TROMETHAMINE 0.5 % OP SOLN
1.0000 [drp] | Freq: Four times a day (QID) | OPHTHALMIC | Status: AC
  Administered 2022-08-10 (×2): 1 [drp] via OPHTHALMIC
  Filled 2022-08-10: qty 5

## 2022-08-10 MED ORDER — LIDOCAINE 2% (20 MG/ML) 5 ML SYRINGE
INTRAMUSCULAR | Status: AC
  Filled 2022-08-10: qty 5

## 2022-08-10 MED ORDER — SODIUM CHLORIDE (PF) 0.9 % IJ SOLN
INTRAMUSCULAR | Status: AC
  Filled 2022-08-10: qty 10

## 2022-08-10 MED ORDER — ROSUVASTATIN CALCIUM 20 MG PO TABS
20.0000 mg | ORAL_TABLET | Freq: Every day | ORAL | Status: DC
  Administered 2022-08-10 – 2022-08-16 (×7): 20 mg via ORAL
  Filled 2022-08-10 (×7): qty 1

## 2022-08-10 MED ORDER — ONDANSETRON HCL 4 MG PO TABS: 4.0000 mg | ORAL_TABLET | Freq: Four times a day (QID) | ORAL | Status: DC | PRN

## 2022-08-10 MED ORDER — ALPRAZOLAM 0.5 MG PO TABS
0.2500 mg | ORAL_TABLET | Freq: Two times a day (BID) | ORAL | Status: DC | PRN
  Administered 2022-08-14 – 2022-08-15 (×2): 0.5 mg via ORAL
  Filled 2022-08-10 (×2): qty 1

## 2022-08-10 MED ORDER — THROMBIN 5000 UNITS EX SOLR
OROMUCOSAL | Status: DC | PRN
  Administered 2022-08-10: 5 mL via TOPICAL

## 2022-08-10 MED ORDER — KETOROLAC TROMETHAMINE 15 MG/ML IJ SOLN
7.5000 mg | Freq: Four times a day (QID) | INTRAMUSCULAR | Status: AC
  Administered 2022-08-10 – 2022-08-11 (×4): 7.5 mg via INTRAVENOUS
  Filled 2022-08-10 (×4): qty 1

## 2022-08-10 MED ORDER — PHENOL 1.4 % MT LIQD: 1.0000 | OROMUCOSAL | Status: DC | PRN

## 2022-08-10 MED ORDER — EPHEDRINE 5 MG/ML INJ
INTRAVENOUS | Status: AC
  Filled 2022-08-10: qty 5

## 2022-08-10 MED ORDER — DOCUSATE SODIUM 100 MG PO CAPS
100.0000 mg | ORAL_CAPSULE | Freq: Two times a day (BID) | ORAL | Status: DC
  Administered 2022-08-10 – 2022-08-12 (×6): 100 mg via ORAL
  Filled 2022-08-10 (×6): qty 1

## 2022-08-10 MED ORDER — OXYCODONE HCL 5 MG PO TABS: 5.0000 mg | ORAL_TABLET | Freq: Once | ORAL | Status: DC | PRN

## 2022-08-10 MED ORDER — SENNA 8.6 MG PO TABS
1.0000 | ORAL_TABLET | Freq: Two times a day (BID) | ORAL | Status: DC
  Administered 2022-08-10 – 2022-08-17 (×9): 8.6 mg via ORAL
  Filled 2022-08-10 (×11): qty 1

## 2022-08-10 MED ORDER — METHOCARBAMOL 500 MG PO TABS
ORAL_TABLET | ORAL | Status: AC
  Filled 2022-08-10: qty 1

## 2022-08-10 MED ORDER — CEFAZOLIN SODIUM-DEXTROSE 2-4 GM/100ML-% IV SOLN
2.0000 g | INTRAVENOUS | Status: AC
  Administered 2022-08-10 (×2): 2 g via INTRAVENOUS
  Filled 2022-08-10: qty 100

## 2022-08-10 MED ORDER — CHLORHEXIDINE GLUCONATE 0.12 % MT SOLN
15.0000 mL | Freq: Once | OROMUCOSAL | Status: AC
  Administered 2022-08-10: 15 mL via OROMUCOSAL
  Filled 2022-08-10: qty 15

## 2022-08-10 SURGICAL SUPPLY — 52 items
ADH SKN CLS APL DERMABOND .7 (GAUZE/BANDAGES/DRESSINGS) ×1
BAG COUNTER SPONGE SURGICOUNT (BAG) ×1 IMPLANT
BAG SPNG CNTER NS LX DISP (BAG) ×1
BIT DRILL NS LF DISP RELINE C (BIT) IMPLANT
BIT DRILL RELINE C (BIT) ×1
BIT DRL NS LF DISP RELINE C (BIT) ×1
BLADE CLIPPER SURG (BLADE) IMPLANT
BONE MATRIX OSTEOCEL PRO LRG (Bone Implant) IMPLANT
CAGE MODULUS XL 8X18X60 - 10 (Cage) IMPLANT
CAGE MODULUS XLW 8X22X55 - 10 (Cage) IMPLANT
DERMABOND ADVANCED .7 DNX12 (GAUZE/BANDAGES/DRESSINGS) ×1 IMPLANT
DRAPE C-ARM 42X72 X-RAY (DRAPES) ×1 IMPLANT
DRAPE C-ARMOR (DRAPES) ×1 IMPLANT
DRAPE LAPAROTOMY 100X72X124 (DRAPES) ×1 IMPLANT
DURAPREP 26ML APPLICATOR (WOUND CARE) ×1 IMPLANT
ELECT REM PT RETURN 9FT ADLT (ELECTROSURGICAL) ×1
ELECTRODE REM PT RTRN 9FT ADLT (ELECTROSURGICAL) ×1 IMPLANT
GAUZE 4X4 16PLY ~~LOC~~+RFID DBL (SPONGE) IMPLANT
GLOVE BIO SURGEON STRL SZ8 (GLOVE) IMPLANT
GLOVE BIOGEL PI IND STRL 7.5 (GLOVE) IMPLANT
GLOVE BIOGEL PI IND STRL 8.5 (GLOVE) ×1 IMPLANT
GLOVE ECLIPSE 8.5 STRL (GLOVE) ×1 IMPLANT
GLOVE INDICATOR 8.5 STRL (GLOVE) IMPLANT
GOWN STRL REUS W/ TWL LRG LVL3 (GOWN DISPOSABLE) IMPLANT
GOWN STRL REUS W/ TWL XL LVL3 (GOWN DISPOSABLE) ×1 IMPLANT
GOWN STRL REUS W/TWL 2XL LVL3 (GOWN DISPOSABLE) ×1 IMPLANT
GOWN STRL REUS W/TWL LRG LVL3 (GOWN DISPOSABLE)
GOWN STRL REUS W/TWL XL LVL3 (GOWN DISPOSABLE) ×5
HDLS TROCR DRIL PIN KNEE 75 (PIN) ×1
HEMOSTAT POWDER KIT SURGIFOAM (HEMOSTASIS) IMPLANT
KIT BASIN OR (CUSTOM PROCEDURE TRAY) ×1 IMPLANT
KIT DILATOR XLIF 5 (KITS) IMPLANT
KIT SURGICAL ACCESS MAXCESS 4 (KITS) IMPLANT
KIT TURNOVER KIT B (KITS) ×1 IMPLANT
MODULE NVM5 NEXT GEN EMG (NEUROSURGERY SUPPLIES) IMPLANT
MODULUS XLW 12X22X55MM 10 (Spine Construct) IMPLANT
NDL HYPO 25X1 1.5 SAFETY (NEEDLE) ×1 IMPLANT
NEEDLE HYPO 25X1 1.5 SAFETY (NEEDLE) ×1 IMPLANT
NS IRRIG 1000ML POUR BTL (IV SOLUTION) ×1 IMPLANT
PACK LAMINECTOMY NEURO (CUSTOM PROCEDURE TRAY) ×1 IMPLANT
PIN DRILL HDLS TROCAR 75 4PK (PIN) IMPLANT
SOL ELECTROSURG ANTI STICK (MISCELLANEOUS)
SOLUTION ELECTROSURG ANTI STCK (MISCELLANEOUS) ×1 IMPLANT
SPONGE T-LAP 4X18 ~~LOC~~+RFID (SPONGE) IMPLANT
SUT VIC AB 2-0 CP2 18 (SUTURE) IMPLANT
SUT VIC AB 3-0 SH 8-18 (SUTURE) ×1 IMPLANT
SUT VIC AB 4-0 RB1 18 (SUTURE) ×1 IMPLANT
TAPE CLOTH 4X10 WHT NS (GAUZE/BANDAGES/DRESSINGS) ×2 IMPLANT
TOWEL GREEN STERILE (TOWEL DISPOSABLE) ×1 IMPLANT
TOWEL GREEN STERILE FF (TOWEL DISPOSABLE) ×1 IMPLANT
TRAY FOLEY MTR SLVR 16FR STAT (SET/KITS/TRAYS/PACK) ×1 IMPLANT
WATER STERILE IRR 1000ML POUR (IV SOLUTION) ×1 IMPLANT

## 2022-08-10 NOTE — Progress Notes (Signed)
Orthopedic Tech Progress Note Patient Details:  Larry Holloway 1950/11/07 [id number]  Ortho Devices Type of Ortho Device: Lumbar corsett Ortho Device/Splint Location: BACK Ortho Device/Splint Interventions: Ordered   Post Interventions Patient Tolerated: Well Instructions Provided: Care of device  [name] 08/10/2022, 4:14 PM

## 2022-08-10 NOTE — H&P (Signed)
Larry Holloway is an 72 y.o. male.   Chief Complaint: Back and bilateral lower extremity pain HPI: Patient is a 72 year old individual who has had previous cervical radicular symptoms and underwent anterior decompression and fusion C5-C7.  I saw the patient about a year ago and he was having increasing pain in the back and lower extremities and he has multilevel degenerative changes in addition to degenerative's scoliosis that is forming across lumbar spine.  He has an anterolisthesis of L4 and L5.  I had advised the patient that he ultimately would require surgery to decompress and stabilize with the area of the degenerative scoliosis in addition to the spondylolisthesis and stenosis at L4-L5.  He would require decompression fusion from L1 to the sacrum.  There is a substantial undertaking and after careful consideration I advised the patient that he would require a staged procedure with an anterolateral decompression at L1 2-3 and 3 4 and then posterior decompression and fusion L4 to the sacrum.  Pedicle screw fixation will be performed from L1 to the sacrum.  He is now being admitted to undergo the staged procedure with the first stage being the anterolateral decompression.  Past Medical History:  Diagnosis Date   Arthritis    Back   Back pain    Cancer of prostate (HCC)    Central serous retinopathy    High blood pressure    High cholesterol    Mild depression    Neck pain    PONV (postoperative nausea and vomiting)    Pre-diabetes    Right arm pain     Past Surgical History:  Procedure Laterality Date   CATARACT EXTRACTION W/ INTRAOCULAR LENS IMPLANT Bilateral 2022   COLONOSCOPY  2023   FRACTURE SURGERY Right 1975   Right Leg   HIP SURGERY Right 2002   Ball and Socket repaired   NECK SURGERY  2014   Cadaver disc placed in neck   PROSTATE SURGERY     2007    Family History  Problem Relation Age of Onset   Cancer - Prostate Father    Cancer - Prostate Brother    High blood  pressure Father    High blood pressure Mother    Social History:  reports that he quit smoking about 29 years ago. His smoking use included cigarettes. He has never used smokeless tobacco. He reports that he does not drink alcohol and does not use drugs.  Allergies:  Allergies  Allergen Reactions   Tape Swelling   Wound Dressing Adhesive Swelling    Medications Prior to Admission  Medication Sig Dispense Refill   ALPRAZolam (XANAX) 0.25 MG tablet Take 0.25-0.5 mg by mouth 2 (two) times daily as needed (Restless leg).     amLODipine (NORVASC) 5 MG tablet Take 7.5 mg by mouth daily.     Ascorbic Acid (VITAMIN C) 1000 MG tablet Take 1,000 mg by mouth daily.     cholecalciferol (VITAMIN D3) 25 MCG (1000 UNIT) tablet Take 1,000 Units by mouth daily.     DULoxetine (CYMBALTA) 60 MG capsule Take 60 mg by mouth daily.     ferrous sulfate 325 (65 FE) MG tablet Take 325 mg by mouth daily with breakfast.     L-Methylfolate 15 MG TABS Take 7.5 mg by mouth daily.     loratadine (CLARITIN) 10 MG tablet Take 10 mg by mouth daily.     Multiple Vitamins-Minerals (MULTIVITAMIN PO) Take 1 tablet by mouth daily. One a day  omeprazole (PRILOSEC) 20 MG capsule Take 20 mg by mouth daily.     pramipexole (MIRAPEX) 0.5 MG tablet Take 0.5 mg by mouth 2 (two) times daily.     rosuvastatin (CRESTOR) 20 MG tablet Take 20 mg by mouth at bedtime.      No results found for this or any previous visit (from the past 48 hour(s)). No results found.  Review of Systems  Constitutional:  Positive for activity change.  Musculoskeletal:  Positive for back pain, gait problem and myalgias.  Neurological:  Positive for weakness and numbness.  All other systems reviewed and are negative.   Blood pressure (!) 145/94, pulse 81, temperature 98.3 F (36.8 C), resp. rate 18, height 5\' 11"  (1.803 m), weight 83.9 kg, SpO2 97 %. Physical Exam Constitutional:      Appearance: Normal appearance. He is normal weight.  HENT:      Head: Normocephalic and atraumatic.     Right Ear: Tympanic membrane, ear canal and external ear normal.     Left Ear: Tympanic membrane, ear canal and external ear normal.     Nose: Nose normal.     Mouth/Throat:     Mouth: Mucous membranes are moist.     Pharynx: Oropharynx is clear.  Eyes:     Extraocular Movements: Extraocular movements intact.     Conjunctiva/sclera: Conjunctivae normal.     Pupils: Pupils are equal, round, and reactive to light.  Cardiovascular:     Rate and Rhythm: Normal rate and regular rhythm.  Pulmonary:     Effort: Pulmonary effort is normal.     Breath sounds: Normal breath sounds.  Abdominal:     General: Abdomen is flat. Bowel sounds are normal.     Palpations: Abdomen is soft.  Musculoskeletal:     Cervical back: Normal range of motion and neck supple.     Comments: Straight leg raising 30 degrees in either lower extremity Patrick's maneuver is positive on the left.  Skin:    General: Skin is warm and dry.     Capillary Refill: Capillary refill takes less than 2 seconds.  Neurological:     Mental Status: He is alert.     Comments: Modest weakness in the iliopsoas and quadriceps bilaterally at 4 out of 5.  Absent reflexes in the patellae and Achilles.  Cranial nerve examination is intact  Psychiatric:        Mood and Affect: Mood normal.        Behavior: Behavior normal.        Thought Content: Thought content normal.        Judgment: Judgment normal.      Assessment/Plan Spondylosis and stenosis L1-2 33445 degenerative scoliosis.  Plan: Anterolateral decompression L1 2-3 and 3 4.  Posterior decompression L4-5 and L5-S1 with fixation L1 to the sacrum.  Staged procedure.  Stefani Dama, MD 08/10/2022, 7:31 AM

## 2022-08-10 NOTE — Anesthesia Postprocedure Evaluation (Signed)
Anesthesia Post Note  Patient: [name]  Procedure(s) Performed: Lumbar One-Two, Lumbar Two-Three, Lumbar Three-Four Anterior Lateral Lumbar Fusion     Patient location during evaluation: PACU Anesthesia Type: General Level of consciousness: awake Pain management: pain level controlled Vital Signs Assessment: post-procedure vital signs reviewed and stable Respiratory status: spontaneous breathing, nonlabored ventilation and respiratory function stable Cardiovascular status: blood pressure returned to baseline and stable Postop Assessment: no apparent nausea or vomiting Anesthetic complications: no  There were no known notable events for this encounter.  Last Vitals:  Vitals:   08/10/22 1330 08/10/22 1348  BP: 124/63 (!) 146/69  Pulse: 83 86  Resp: (!) 22 18  Temp:  36.6 C  SpO2: 94% 100%    Last Pain:  Vitals:   08/10/22 1458  TempSrc:   PainSc: 5                  Larry Holloway

## 2022-08-10 NOTE — Transfer of Care (Signed)
Immediate Anesthesia Transfer of Care Note  Patient: Larry Holloway  Procedure(s) Performed: Lumbar One-Two, Lumbar Two-Three, Lumbar Three-Four Anterior Lateral Lumbar Fusion  Patient Location: PACU  Anesthesia Type:General  Level of Consciousness: awake, alert , and oriented  Airway & Oxygen Therapy: Patient Spontanous Breathing and Patient connected to face mask oxygen  Post-op Assessment: Report given to RN, Post -op Vital signs reviewed and stable, and Patient moving all extremities X 4  Post vital signs: Reviewed and stable  Last Vitals:  Vitals Value Taken Time  BP 116/65 08/10/22 1219  Temp    Pulse 85 08/10/22 1222  Resp 17 08/10/22 1222  SpO2 95 % 08/10/22 1222  Vitals shown include unvalidated device data.  Last Pain:  Vitals:   08/10/22 0623  PainSc: 4       Patients Stated Pain Goal: 3 (08/10/22 1610)  Complications: There were no known notable events for this encounter.

## 2022-08-10 NOTE — Anesthesia Procedure Notes (Addendum)
Procedure Name: Intubation Date/Time: 08/10/2022 7:48 AM  Performed by: Quentin Ore, CRNAPre-anesthesia Checklist: Patient identified, Emergency Drugs available, Suction available and Patient being monitored Patient Re-evaluated:Patient Re-evaluated prior to induction Oxygen Delivery Method: Circle System Utilized Preoxygenation: Pre-oxygenation with 100% oxygen Induction Type: IV induction Ventilation: Oral airway inserted - appropriate to patient size and Mask ventilation without difficulty Laryngoscope Size: Mac and 4 Grade View: Grade I Tube type: Oral Tube size: 7.5 mm Number of attempts: 1 Airway Equipment and Method: Stylet and Oral airway Placement Confirmation: ETT inserted through vocal cords under direct vision, positive ETCO2 and breath sounds checked- equal and bilateral Secured at: 23 cm Tube secured with: Tape Dental Injury: Teeth and Oropharynx as per pre-operative assessment  Comments: Intubation by Charlann Noss, SRNA with Dr. Isaias Cowman supervising.

## 2022-08-10 NOTE — Op Note (Addendum)
Date of surgery: 08/10/2022 Preoperative diagnosis: Spondylolisthesis L4-5 scoliosis of the lumbar spine, degenerative with lumbar spinal stenosis and radiculopathy.  L1-L2 3 L3-4 L4-5 and L5-S1. Postoperative diagnosis: Same Procedure: Anterolateral decompression using XLIF technique L1-L2 3 L3-4 and L4-5 structural spacer with allograft arthrodesis or scopic visualization EMG neuromonitoring. Surgeon: Barnett Abu First Assistant: Larry Citrin MD Anesthesia: General endotracheal Indications: Larry Holloway is a 72 year old individual who has had significant back pain and leg pain with a degenerative spondylolisthesis and a high-grade stenosis at L4-5 and degenerative scoliosis which is contributed to significant back pain.  He has been treated conservatively for the past years time and though he had transient relief with epidural steroid injections the relief seems to be waning at a faster rate.  He has been advised and surgical intervention has been discussed to decompress and stabilize from L1 to the sacrum.  He is now admitted for a staged procedure to go anterolateral decompressions from L1-L5 and in a couple days undergo a second procedure with posterior decompression and stabilization from L1 to the sacrum with pedicle fixation.  Procedure: The patient was brought to the operating room supine on the stretcher.  After the smooth induction of general endotracheal anesthesia he was carefully turned into the left lateral decubitus position and the hips were taped in the position.  Some localizing fluoroscopic images were obtained to check orthotic analogy of the vertebral bodies and the table was extended at the lumbar spine in order to provide some correction and reduction of his scoliotic curvature.  The entry sites into the disc spaces at L1-2 334 and 4 5 are localized.  There is uncertain whether L4-5 would be approachable however once the patient was positioned it was evident that he would be approachable  through the lateral means.  The skin was then prepped with alcohol DuraPrep and draped in a sterile fashion.  2 marked incisions made to split the difference between the L3-4 and L4-5 space and the L1-2 and L2-3 space.  A posterior incision was created to allow dissection into the retroperitoneal space.  The incisions were opened first at L4-5 and the posterior incision and a finger was inserted into the retroperitoneal space posteriorly to allow guidance through the lateral aperture and fascia into the retroperitoneal space.  Then a guide tube was placed over the L4-5 space and radiographic confirmation was obtained.  Stimulation was performed to make sure that it was no involvement of the lumbar plexus initial pass yielded involvement of the lumbar plexus and the second pass was used retracting the psoas muscle posteriorly with the tip of the probe to protect it better.  On the second pass no stimulation of the lumbar plexus musculature was obtained K wire was passed into the disc space and a series of dilators were used each time checking for neural silence.  Then a self-retaining retractor was placed over the largest dilator and this was opened to allow placement of a shim into the disc space at L4-5 under direct visualization.  It was noted that there was a large lateral osteophyte on the right side from which we are approaching.  With that the the lateral tissues were uncovered the psoas muscle was dissected away from the large osteophyte and the osteophyte was opened to enter the disc space.  A Cobb retractor was then placed through the disc space to the opposite side of the interspace first in the superior direction and then an inferior direction.  This then allowed a 4  mm spacer to be placed into the interspace to allow for some dissection with this some dissection of the endplates could be obtained and the 6 mm spacer was placed this measured 16 mm in anterior posterior direction then an 8 mm trial was  placed across the interspace.  The endplates were curettaged as best possible using a respiratory very minimal disc material was removed from the space it was not felt that a 22 mm spacer would be able to be placed safely as the spondylolisthesis did not appear to be reducing to any significant degree ultimately and 18 x 16 mm spacer with 10 degrees lordosis was placed into the interspace it was filled with Osteocel after the endplates were adequately decorticated.  Final AP and lateral radiograph was obtained here then the retractor was removed and the same procedure was repeated at the L3-4 level.  At L3-4 a shim was placed into the posterior aspect of the disc space once the retractor was placed and neural silence was noted on EMG stimulation as we approached through the psoas.  Larry Holloway help with the dissection on these levels by working across from me to allow decompression of the more dorsal aspect of the interspace while I worked towards the ventral aspect.  At L3-4 the disc space was rather large and it was opened with a 15 blade and a combination of curettes and rongeurs was used to evacuate a substantial quantity of moderately degenerated disc material.  The endplates were curettaged and the interspace was then sized and open to allow good decompression of the endplates.  The sizers were rapidly able to be increased to the 8 and 10 mm then 12 mm size a 22 mm anterior posterior dimension spacer was able to be fitted into this interval and it was filled with Osteocel.  He was placed under direct visualization and retractor was removed.  Next L2-3 was instrumented in a similar fashion again electrical silence was noted on EMG stimulation with all the dilators and here the disc space was noted to be quite collapsed and a large lateral osteophyte was again encountered this was opened and some very modest amounts of disc material removed from within the disc space by both myself and Larry Holloway working across  from each other.  At L2-3 the interspace was then opened with the 4 and 6 mm spacers and it was felt that nothing greater than an 18 mm spacer would fit into this interval an 18 x 22 mm spacer was able to be fitted after adequately opening the interspace and decorticating the endplates with only a modest amount of disc material being removed once this was completed attention was turned to the level above.  At the L1-2 interspace the area between the 10th and 11th ribs was dissected to allow access to the interspace and the first probe was laced in this area.  The pleura could be seen just above this region was retracted.  The pleura did not appear to be entered.  The disc space was opened in this area and was again significantly collapsed with only a modest amount of disc material being removed mostly by Larry Holloway a series of dilators including the smallest dilator was first used from the 4 to the 6 mm sized ultimately an 8 x 22 x 55 mm spacer was able to be fitted into this interval as it was at the L3-4 space and this was filled with Osteocel.  Once the spacer was placed all  the retractors were removed care was taken to make sure there was good hemostasis in the soft tissues final fluoroscopic images was obtained in the AP and lateral projection and then the incisions were closed with 2-0 Vicryl in the fascia and 3-0 Vicryl and 4-0 Vicryl subcuticular skin.  Blood loss for the procedure was estimated at approximately 100 cc.  Patient was returned to the recovery room in stable condition.

## 2022-08-11 ENCOUNTER — Inpatient Hospital Stay (HOSPITAL_COMMUNITY): Payer: Medicare Other

## 2022-08-11 ENCOUNTER — Encounter (HOSPITAL_COMMUNITY): Payer: Self-pay | Admitting: Neurological Surgery

## 2022-08-11 LAB — BASIC METABOLIC PANEL
Anion gap: 9 (ref 5–15)
BUN: 11 mg/dL (ref 8–23)
CO2: 27 mmol/L (ref 22–32)
Calcium: 8.7 mg/dL — ABNORMAL LOW (ref 8.9–10.3)
Chloride: 97 mmol/L — ABNORMAL LOW (ref 98–111)
Creatinine, Ser: 0.85 mg/dL (ref 0.61–1.24)
GFR, Estimated: >60 mL/min (ref >60)
Glucose, Bld: 122 mg/dL — ABNORMAL HIGH (ref 70–99)
Potassium: 4.5 mmol/L (ref 3.5–5.1)
Sodium: 133 mmol/L — ABNORMAL LOW (ref 135–145)

## 2022-08-11 LAB — CBC
HCT: 34.1 % — ABNORMAL LOW (ref 39.0–52.0)
Hemoglobin: 11.5 g/dL — ABNORMAL LOW (ref 13.0–17.0)
MCH: 31.2 pg (ref 26.0–34.0)
MCHC: 33.7 g/dL (ref 30.0–36.0)
MCV: 92.4 fL (ref 80.0–100.0)
Platelets: 252 10*3/uL (ref 150–400)
RBC: 3.69 MIL/uL — ABNORMAL LOW (ref 4.22–5.81)
RDW: 14.6 % (ref 11.5–15.5)
WBC: 9.1 10*3/uL (ref 4.0–10.5)
nRBC: 0 % (ref 0.0–0.2)

## 2022-08-11 NOTE — Evaluation (Signed)
Occupational Therapy Evaluation Patient Details Name: Larry Holloway MRN: [id number] DOB: [birth year] Today's Date: 08/11/2022   History of Present Illness 72 yo M adm 5/13 for a two stage procedure.  He has currenlty undergone an anterolateral decompression at L1 2-3 and 3 4, and on Thursday 5/16 he will undergo the posterior decompression and fusion L4 to the sacrum.  PMH includes: Arthritis Back, Cancer of prostate, High blood pressure, PONV (postoperative nausea and vomiting) and Pre-diabetes.   Clinical Impression   Patient admitted for the procedure above.  He will have the second surgery 5/16.  OT will continue efforts in the acute setting to address any deficits after the second procedure, and assist with any discharge recommendations.  Currently he is moving well in the room, close to Mod I, a little unsteady with initial sit to stand, and is able to place socks using figure four without assist.  Patient should do well, and for now, no post acute OT is recommended.        Recommendations for follow up therapy are one component of a multi-disciplinary discharge planning process, led by the attending physician.  Recommendations may be updated based on patient status, additional functional criteria and insurance authorization.   Assistance Recommended at Discharge Intermittent Supervision/Assistance  Patient can return home with the following Assist for transportation    Functional Status Assessment  Patient has had a recent decline in their functional status and demonstrates the ability to make significant improvements in function in a reasonable and predictable amount of time.  Equipment Recommendations  None recommended by OT    Recommendations for Other Services       Precautions / Restrictions Precautions Precautions: Back Precaution Booklet Issued: Yes (comment) Precaution Comments: reviewed Required Braces or Orthoses: Spinal Brace Spinal Brace: Lumbar  corset Restrictions Weight Bearing Restrictions: No      Mobility Bed Mobility Overal bed mobility: Modified Independent                  Transfers Overall transfer level: Needs assistance   Transfers: Sit to/from Stand, Bed to chair/wheelchair/BSC Sit to Stand: Supervision     Step pivot transfers: Modified independent (Device/Increase time)     General transfer comment: a little unsteady with initial sit to stand      Balance Overall balance assessment: Mild deficits observed, not formally tested                                         ADL either performed or assessed with clinical judgement   ADL       Grooming: Oral care;Set up;Standing               Lower Body Dressing: Supervision/safety;Sit to/from stand   Toilet Transfer: Pharmacist, community;Ambulation                   Vision Patient Visual Report: No change from baseline       Perception     Praxis      Pertinent Vitals/Pain Pain Assessment Pain Assessment: Faces Faces Pain Scale: Hurts little more Pain Location: Incisional Pain Descriptors / Indicators: Tender, Aching Pain Intervention(s): Monitored during session     Hand Dominance Right   Extremity/Trunk Assessment Upper Extremity Assessment Upper Extremity Assessment: Overall WFL for tasks assessed   Lower Extremity Assessment Lower Extremity Assessment: Defer to PT evaluation   Cervical /  Trunk Assessment Cervical / Trunk Assessment: Back Surgery   Communication Communication Communication: No difficulties   Cognition Arousal/Alertness: Awake/alert Behavior During Therapy: WFL for tasks assessed/performed Overall Cognitive Status: Within Functional Limits for tasks assessed                                                        Home Living Family/patient expects to be discharged to:: Private residence Living Arrangements: Spouse/significant  other;Children Available Help at Discharge: Family;Available 24 hours/day Type of Home: House Home Access: Stairs to enter [employer] of Steps: 3-4 Entrance Stairs-Rails: Right Home Layout: Two level;Bed/bath upstairs Alternate Level Stairs-Number of Steps: 13 Alternate Level Stairs-Rails: Right;Left;Can reach both Bathroom Shower/Tub: Tub/shower unit;Walk-in shower   Bathroom Toilet: Standard Bathroom Accessibility: Yes How Accessible: Accessible via walker Home Equipment: None          Prior Functioning/Environment Prior Level of Function : Independent/Modified Independent;Driving                        OT Problem List: Pain      OT Treatment/Interventions: Self-care/ADL training;Therapeutic activities;Patient/family education;Balance training    OT Goals(Current goals can be found in the care plan section) Acute Rehab OT Goals Patient Stated Goal: Return home OT Goal Formulation: With patient Time For Goal Achievement: 08/25/22 Potential to Achieve Goals: Good ADL Goals Pt Will Perform Grooming: with modified independence;standing Pt Will Perform Lower Body Dressing: with modified independence;sit to/from stand Pt Will Transfer to Toilet: Independently;ambulating;regular height toilet  OT Frequency: Min 2X/week    Co-evaluation              AM-PAC OT "6 Clicks" Daily Activity     Outcome Measure Help from another person eating meals?: None Help from another person taking care of personal grooming?: None Help from another person toileting, which includes using toliet, bedpan, or urinal?: A Little Help from another person bathing (including washing, rinsing, drying)?: A Little Help from another person to put on and taking off regular upper body clothing?: None Help from another person to put on and taking off regular lower body clothing?: A Little 6 Click Score: 21   End of Session Nurse Communication: Mobility status  Activity  Tolerance: Patient tolerated treatment well Patient left: in bed;with call bell/phone within reach  OT Visit Diagnosis: Unsteadiness on feet (R26.81)                Time: 1610-9604 OT Time Calculation (min): 20 min Charges:  OT General Charges $OT Visit: 1 Visit OT Evaluation $OT Eval Moderate Complexity: 1 Mod  08/11/2022  RP, OTR/L  Acute Rehabilitation Services  Office:  [phone]   ALANA DELO 08/11/2022, 9:39 AM

## 2022-08-11 NOTE — Progress Notes (Signed)
Patient ID: Larry Holloway, male   DOB: March 16, 1951, 72 y.o.   MRN: [id number] Alert awake feeling well.No motor strength deficits. Incision clean and dry. Post op labs good only slight drop in HCT. Will obtain xray spine today. Part two on Thursday.

## 2022-08-11 NOTE — Evaluation (Signed)
Physical Therapy Evaluation  Patient Details Name: Larry Holloway MRN: [id number] DOB: 11-29-1950 Today's Date: 08/11/2022  History of Present Illness  Pt is a 72 y/o male who presents 08/10/2022 for a two stage spinal surgery. He is s/p L1-L4 ALIF on 08/10/22 and will return to the OR on thursday 08/13/22 for L4-S1 PLIF. PMH signficant for prostate CA, HTN, pre-diabetes.   Clinical Impression  Pt admitted with above diagnosis. At the time of PT eval, pt was able to demonstrate transfers and ambulation with gross min guard assist and RW for support. Pt was educated on precautions, brace application/wearing schedule, appropriate activity progression, and car transfer. Pt currently with functional limitations due to the deficits listed below (see PT Problem List). Pt will benefit from skilled PT to increase their independence and safety with mobility to allow discharge to the venue listed below.         Recommendations for follow up therapy are one component of a multi-disciplinary discharge planning process, led by the attending physician.  Recommendations may be updated based on patient status, additional functional criteria and insurance authorization.  Follow Up Recommendations       Assistance Recommended at Discharge PRN  Patient can return home with the following  A little help with walking and/or transfers;A little help with bathing/dressing/bathroom;Assistance with cooking/housework;Assist for transportation;Help with stairs or ramp for entrance    Equipment Recommendations    Recommendations for Other Services       Functional Status Assessment Patient has had a recent decline in their functional status and demonstrates the ability to make significant improvements in function in a reasonable and predictable amount of time.     Precautions / Restrictions Precautions Precautions: Fall;Back Precaution Booklet Issued: Yes (comment) Precaution Comments: Reviewed handout and pt was  cued for precautions during functional mobility. Required Braces or Orthoses: Spinal Brace Spinal Brace: Lumbar corset;Applied in sitting position Restrictions Weight Bearing Restrictions: No      Mobility  Bed Mobility Overal bed mobility: Modified Independent             General bed mobility comments: HOB flat and rails lowered to simulate home environment.    Transfers Overall transfer level: Needs assistance Equipment used: None Transfers: Sit to/from Stand Sit to Stand: Min guard           General transfer comment: Pt demonstrated good hand placement on seated surface for safety. No assist required but close guard provided for safety. Appears mildly unsteady.    Ambulation/Gait Ambulation/Gait assistance: Min guard Gait Distance (Feet): 560 Feet Assistive device: None Gait Pattern/deviations: Step-through pattern, Decreased stride length, Trunk flexed Gait velocity: Decreased Gait velocity interpretation: 1.31 - 2.62 ft/sec, indicative of limited community ambulator   General Gait Details: VC's for improved posture and forward gaze. Pt appears guarded but overall without overt LOB noted.  Stairs            Wheelchair Mobility    Modified Rankin (Stroke Patients Only)       Balance Overall balance assessment: Mild deficits observed, not formally tested                                           Pertinent Vitals/Pain Pain Assessment Pain Assessment: Faces Faces Pain Scale: Hurts little more Pain Location: Incisional Pain Descriptors / Indicators: Aching, Operative site guarding, Sore Pain Intervention(s): Limited activity within patient's tolerance,  Monitored during session, Repositioned    Home Living Family/patient expects to be discharged to:: Private residence Living Arrangements: Spouse/significant other;Children Available Help at Discharge: Family;Available 24 hours/day Type of Home: House Home Access: Stairs to  enter Entrance Stairs-Rails: Right Entrance Stairs-Number of Steps: 3-4 Alternate Level Stairs-Number of Steps: 13 Home Layout: Two level;Bed/bath upstairs Home Equipment: None      Prior Function Prior Level of Function : Independent/Modified Independent;Driving                     Hand Dominance   Dominant Hand: Right    Extremity/Trunk Assessment   Upper Extremity Assessment Upper Extremity Assessment: Defer to OT evaluation    Lower Extremity Assessment Lower Extremity Assessment: Generalized weakness (Mild; consistent with pre-op diagnosis)    Cervical / Trunk Assessment Cervical / Trunk Assessment: Back Surgery  Communication   Communication: No difficulties  Cognition Arousal/Alertness: Awake/alert Behavior During Therapy: WFL for tasks assessed/performed Overall Cognitive Status: Within Functional Limits for tasks assessed                                          General Comments      Exercises     Assessment/Plan    PT Assessment Patient needs continued PT services  PT Problem List Decreased strength;Decreased range of motion;Decreased activity tolerance;Decreased balance;Decreased mobility;Decreased knowledge of use of DME;Decreased safety awareness;Decreased knowledge of precautions;Pain       PT Treatment Interventions DME instruction;Gait training;Stair training;Functional mobility training;Therapeutic activities;Therapeutic exercise;Balance training;Patient/family education    PT Goals (Current goals can be found in the Care Plan section)  Acute Rehab PT Goals 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 PT Goal Formulation: With patient Time For Goal Achievement: 08/18/22 Potential to Achieve Goals: Good    Frequency Min 5X/week     Co-evaluation               AM-PAC PT "6 Clicks" Mobility  Outcome Measure Help needed turning from your back to your side while in a flat bed without using bedrails?: A  Little Help needed moving from lying on your back to sitting on the side of a flat bed without using bedrails?: A Little Help needed moving to and from a bed to a chair (including a wheelchair)?: A Little Help needed standing up from a chair using your arms (e.g., wheelchair or bedside chair)?: A Little Help needed to walk in hospital room?: A Little Help needed climbing 3-5 steps with a railing? : A Little 6 Click Score: 18    End of Session Equipment Utilized During Treatment: Gait belt Activity Tolerance: Patient tolerated treatment well Patient left: in bed;with call bell/phone within reach Nurse Communication: Mobility status PT Visit Diagnosis: Unsteadiness on feet (R26.81);Pain    Time: 1047-1106 PT Time Calculation (min) (ACUTE ONLY): 19 min   Charges:   PT Evaluation $PT Eval Low Complexity: 1 Low          Conni Slipper, PT, DPT Acute Rehabilitation Services Secure Chat Preferred Office: [phone]   Marylynn Pearson 08/11/2022, 11:56 AM

## 2022-08-12 MED ORDER — CHLORHEXIDINE GLUCONATE CLOTH 2 % EX PADS
6.0000 | MEDICATED_PAD | Freq: Once | CUTANEOUS | Status: AC
  Administered 2022-08-13: 6 via TOPICAL

## 2022-08-12 MED ORDER — MAGNESIUM CITRATE PO SOLN
1.0000 | Freq: Once | ORAL | Status: AC
  Administered 2022-08-12: 1 via ORAL
  Filled 2022-08-12: qty 296

## 2022-08-12 MED ORDER — MAGNESIUM HYDROXIDE 400 MG/5ML PO SUSP
30.0000 mL | Freq: Every day | ORAL | Status: DC | PRN
  Administered 2022-08-12 – 2022-08-15 (×2): 30 mL via ORAL
  Filled 2022-08-12 (×2): qty 30

## 2022-08-12 NOTE — Progress Notes (Signed)
Physical Therapy Treatment Patient Details Name: Larry Holloway MRN: [id number] DOB: 27-Jan-1951 Today's Date: 08/12/2022   History of Present Illness Pt is a 72 y/o male who presents 08/10/2022 for a two stage spinal surgery. He is s/p L1-L4 ALIF on 08/10/22 and will return to the OR on thursday 08/13/22 for L4-S1 PLIF. PMH signficant for prostate CA, HTN, pre-diabetes.    PT Comments    Pt with continued progress towards acute goals. Demonstrating functional transfers, gait and stair negotiation with grossly min guard assist with RW for support. Pt demonstrating good adherence to all precautions throughout mobility, with light cues for adherence during stair training. Reinforced education re: precautions, brace application/wearing schedule and appropriate activity progression with pt verbalizing understanding. Pt continues to benefit from skilled PT services to progress toward functional mobility goals.    Recommendations for follow up therapy are one component of a multi-disciplinary discharge planning process, led by the attending physician.  Recommendations may be updated based on patient status, additional functional criteria and insurance authorization.  Follow Up Recommendations       Assistance Recommended at Discharge PRN  Patient can return home with the following A little help with walking and/or transfers;A little help with bathing/dressing/bathroom;Assistance with cooking/housework;Assist for transportation;Help with stairs or ramp for entrance   Equipment Recommendations       Recommendations for Other Services       Precautions / Restrictions Precautions Precautions: Fall;Back Precaution Booklet Issued: Yes (comment) Precaution Comments: Reviewed handout and pt was cued for precautions during functional mobility. Required Braces or Orthoses: Spinal Brace Spinal Brace: Lumbar corset;Applied in sitting position Restrictions Weight Bearing Restrictions: No      Mobility  Bed Mobility Overal bed mobility: Modified Independent             General bed mobility comments: HOB flat and rails lowered to simulate home environment.    Transfers Overall transfer level: Needs assistance Equipment used: None Transfers: Sit to/from Stand Sit to Stand: Min guard           General transfer comment: Pt demonstrated good hand placement on seated surface for safety. No assist required but close guard provided for safety.    Ambulation/Gait Ambulation/Gait assistance: Min guard Gait Distance (Feet): 560 Feet Assistive device: None Gait Pattern/deviations: Step-through pattern, Decreased stride length, Trunk flexed Gait velocity: Decreased     General Gait Details: VC's for improved posture and forward gaze. no overt LOB noted, some steppage on R as pt reporting decreased DF (chronic) educated pt on possiblity of AFO in future with pt open to idea   Stairs Stairs: Yes Stairs assistance: Min guard Stair Management: One rail Right, Step to pattern, Forwards Number of Stairs: 12 (flight) General stair comments: pt initially with alternating step-over step pattern, educated pt on benefits and increased safety of step-to pattern with pt able to demonstrate back and in agreement. educated pt on limiting pulling self up with UE on rail to maintain adherence to back precautions   Wheelchair Mobility    Modified Rankin (Stroke Patients Only)       Balance Overall balance assessment: Mild deficits observed, not formally tested                                          Cognition Arousal/Alertness: Awake/alert Behavior During Therapy: WFL for tasks assessed/performed Overall Cognitive Status: Within Functional Limits for tasks  assessed                                          Exercises      General Comments        Pertinent Vitals/Pain Pain Assessment Pain Assessment: Faces Faces Pain Scale:  Hurts a little bit Pain Location: Incisional Pain Descriptors / Indicators: Aching, Operative site guarding, Sore Pain Intervention(s): Monitored during session, Limited activity within patient's tolerance    Home Living                          Prior Function            PT Goals (current goals can now be found in the care plan section) Acute Rehab PT Goals PT Goal Formulation: With patient Time For Goal Achievement: 08/18/22 Progress towards PT goals: Progressing toward goals    Frequency    Min 5X/week      PT Plan      Co-evaluation              AM-PAC PT "6 Clicks" Mobility   Outcome Measure  Help needed turning from your back to your side while in a flat bed without using bedrails?: A Little Help needed moving from lying on your back to sitting on the side of a flat bed without using bedrails?: A Little Help needed moving to and from a bed to a chair (including a wheelchair)?: A Little Help needed standing up from a chair using your arms (e.g., wheelchair or bedside chair)?: A Little Help needed to walk in hospital room?: A Little Help needed climbing 3-5 steps with a railing? : A Little 6 Click Score: 18    End of Session Equipment Utilized During Treatment: Gait belt Activity Tolerance: Patient tolerated treatment well Patient left: in bed;with call bell/phone within reach Nurse Communication: Mobility status PT Visit Diagnosis: Unsteadiness on feet (R26.81);Pain     Time: 8119-1478 PT Time Calculation (min) (ACUTE ONLY): 23 min  Charges:  $Gait Training: 8-22 mins $Therapeutic Activity: 8-22 mins                     Meziah Blasingame R. PTA Acute Rehabilitation Services Office: [phone]    Catalina Antigua 08/12/2022, 1:48 PM

## 2022-08-12 NOTE — Anesthesia Preprocedure Evaluation (Addendum)
Anesthesia Evaluation  Patient identified by MRN, date of birth, ID band Patient awake    Reviewed: Allergy & Precautions, H&P , NPO status , Patient's Chart, lab work & pertinent test results  History of Anesthesia Complications (+) PONV and history of anesthetic complications  Airway Mallampati: II  TM Distance: >3 FB Neck ROM: Full    Dental no notable dental hx. (+) Teeth Intact, Dental Advisory Given   Pulmonary former smoker   Pulmonary exam normal breath sounds clear to auscultation       Cardiovascular Exercise Tolerance: Good hypertension, Pt. on medications  Rhythm:Regular Rate:Normal     Neuro/Psych    Depression    negative neurological ROS     GI/Hepatic negative GI ROS, Neg liver ROS,,,  Endo/Other  negative endocrine ROS    Renal/GU negative Renal ROS  negative genitourinary   Musculoskeletal  (+) Arthritis , Osteoarthritis,    Abdominal   Peds  Hematology negative hematology ROS (+)   Anesthesia Other Findings   Reproductive/Obstetrics negative OB ROS                             Anesthesia Physical Anesthesia Plan  ASA: 2  Anesthesia Plan: General   Post-op Pain Management: Ofirmev IV (intra-op)*   Induction: Intravenous  PONV Risk Score and Plan: 4 or greater and Dexamethasone and Treatment may vary due to age or medical condition  Airway Management Planned: Oral ETT  Additional Equipment: Arterial line  Intra-op Plan:   Post-operative Plan: Extubation in OR  Informed Consent: I have reviewed the patients History and Physical, chart, labs and discussed the procedure including the risks, benefits and alternatives for the proposed anesthesia with the patient or authorized representative who has indicated his/her understanding and acceptance.     Dental advisory given  Plan Discussed with: CRNA  Anesthesia Plan Comments:        Anesthesia Quick  Evaluation

## 2022-08-12 NOTE — Progress Notes (Signed)
Patient ID: Larry Holloway, male   DOB: 1950/10/04, 72 y.o.   MRN: [id number] Vital signs are stable motor function is good AP and lateral radiograph shows stable adequate decompression curvature still remains.  For part 2 tomorrow.

## 2022-08-13 ENCOUNTER — Inpatient Hospital Stay (HOSPITAL_COMMUNITY): Payer: Medicare Other

## 2022-08-13 ENCOUNTER — Inpatient Hospital Stay (HOSPITAL_COMMUNITY): Payer: Medicare Other | Admitting: Physician Assistant

## 2022-08-13 ENCOUNTER — Inpatient Hospital Stay (HOSPITAL_COMMUNITY): Payer: Medicare Other | Admitting: Certified Registered Nurse Anesthetist

## 2022-08-13 ENCOUNTER — Inpatient Hospital Stay (HOSPITAL_COMMUNITY): Admission: RE | Admit: 2022-08-13 | Payer: Medicare Other | Source: Home / Self Care | Admitting: Neurological Surgery

## 2022-08-13 ENCOUNTER — Encounter (HOSPITAL_COMMUNITY): Payer: Self-pay | Admitting: Neurological Surgery

## 2022-08-13 ENCOUNTER — Encounter (HOSPITAL_COMMUNITY)
Admission: RE | Disposition: A | Discharge status: Home Health | Payer: Self-pay | Source: Home / Self Care | Attending: Neurological Surgery

## 2022-08-13 DIAGNOSIS — M4186 Other forms of scoliosis, lumbar region: Secondary | ICD-10-CM

## 2022-08-13 DIAGNOSIS — M1990 Unspecified osteoarthritis, unspecified site: Secondary | ICD-10-CM

## 2022-08-13 DIAGNOSIS — M4726 Other spondylosis with radiculopathy, lumbar region: Secondary | ICD-10-CM

## 2022-08-13 DIAGNOSIS — Z87891 Personal history of nicotine dependence: Secondary | ICD-10-CM

## 2022-08-13 DIAGNOSIS — M4316 Spondylolisthesis, lumbar region: Secondary | ICD-10-CM

## 2022-08-13 DIAGNOSIS — I10 Essential (primary) hypertension: Secondary | ICD-10-CM

## 2022-08-13 DIAGNOSIS — M48061 Spinal stenosis, lumbar region without neurogenic claudication: Secondary | ICD-10-CM

## 2022-08-13 SURGERY — POSTERIOR LUMBAR FUSION 2 LEVEL
Anesthesia: General | Site: Back

## 2022-08-13 MED ORDER — PHENYLEPHRINE 80 MCG/ML (10ML) SYRINGE FOR IV PUSH (FOR BLOOD PRESSURE SUPPORT)
PREFILLED_SYRINGE | INTRAVENOUS | Status: DC | PRN
  Administered 2022-08-13: 160 ug via INTRAVENOUS
  Administered 2022-08-13 (×2): 80 ug via INTRAVENOUS

## 2022-08-13 MED ORDER — CEFAZOLIN SODIUM-DEXTROSE 2-4 GM/100ML-% IV SOLN
2.0000 g | Freq: Three times a day (TID) | INTRAVENOUS | Status: AC
  Administered 2022-08-13 – 2022-08-14 (×2): 2 g via INTRAVENOUS
  Filled 2022-08-13 (×2): qty 100

## 2022-08-13 MED ORDER — ACETAMINOPHEN 325 MG PO TABS
650.0000 mg | ORAL_TABLET | ORAL | Status: DC | PRN
  Administered 2022-08-15: 650 mg via ORAL
  Filled 2022-08-13: qty 2

## 2022-08-13 MED ORDER — BUPIVACAINE HCL (PF) 0.5 % IJ SOLN
INTRAMUSCULAR | Status: AC
  Filled 2022-08-13: qty 30

## 2022-08-13 MED ORDER — SUGAMMADEX SODIUM 200 MG/2ML IV SOLN
INTRAVENOUS | Status: DC | PRN
  Administered 2022-08-13: 200 mg via INTRAVENOUS

## 2022-08-13 MED ORDER — SODIUM CHLORIDE 0.9% FLUSH
3.0000 mL | Freq: Two times a day (BID) | INTRAVENOUS | Status: DC
  Administered 2022-08-13 – 2022-08-17 (×8): 3 mL via INTRAVENOUS

## 2022-08-13 MED ORDER — MIDAZOLAM HCL 2 MG/2ML IJ SOLN
INTRAMUSCULAR | Status: DC | PRN
  Administered 2022-08-13: 2 mg via INTRAVENOUS

## 2022-08-13 MED ORDER — POLYETHYLENE GLYCOL 3350 17 G PO PACK: 17.0000 g | PACK | Freq: Every day | ORAL | Status: DC | PRN

## 2022-08-13 MED ORDER — PROPOFOL 500 MG/50ML IV EMUL
INTRAVENOUS | Status: DC | PRN
  Administered 2022-08-13: 50 ug/kg/min via INTRAVENOUS

## 2022-08-13 MED ORDER — HYDROMORPHONE HCL 1 MG/ML IJ SOLN
INTRAMUSCULAR | Status: AC
  Filled 2022-08-13: qty 0.5

## 2022-08-13 MED ORDER — MUPIROCIN 2 % EX OINT
1.0000 | TOPICAL_OINTMENT | Freq: Two times a day (BID) | CUTANEOUS | Status: DC
  Administered 2022-08-13 – 2022-08-17 (×8): 1 via TOPICAL
  Filled 2022-08-13 (×4): qty 22

## 2022-08-13 MED ORDER — LIDOCAINE-EPINEPHRINE 1 %-1:100000 IJ SOLN
INTRAMUSCULAR | Status: DC | PRN
  Administered 2022-08-13: 10 mL via SURGICAL_CAVITY

## 2022-08-13 MED ORDER — ONDANSETRON HCL 4 MG/2ML IJ SOLN
INTRAMUSCULAR | Status: DC | PRN
  Administered 2022-08-13: 4 mg via INTRAVENOUS

## 2022-08-13 MED ORDER — MIDAZOLAM HCL 2 MG/2ML IJ SOLN
INTRAMUSCULAR | Status: AC
  Filled 2022-08-13: qty 2

## 2022-08-13 MED ORDER — KETAMINE HCL 50 MG/5ML IJ SOSY
PREFILLED_SYRINGE | INTRAMUSCULAR | Status: AC
  Filled 2022-08-13: qty 5

## 2022-08-13 MED ORDER — CEFAZOLIN SODIUM-DEXTROSE 2-3 GM-%(50ML) IV SOLR
INTRAVENOUS | Status: DC | PRN
  Administered 2022-08-13 (×2): 2 g via INTRAVENOUS

## 2022-08-13 MED ORDER — PHENYLEPHRINE HCL-NACL 20-0.9 MG/250ML-% IV SOLN
INTRAVENOUS | Status: DC | PRN
  Administered 2022-08-13: 25 ug/min via INTRAVENOUS

## 2022-08-13 MED ORDER — ONDANSETRON HCL 4 MG/2ML IJ SOLN: 4.0000 mg | Freq: Four times a day (QID) | INTRAMUSCULAR | Status: DC | PRN

## 2022-08-13 MED ORDER — ACETAMINOPHEN 650 MG RE SUPP: 650.0000 mg | RECTAL | Status: DC | PRN

## 2022-08-13 MED ORDER — DEXAMETHASONE SODIUM PHOSPHATE 10 MG/ML IJ SOLN
INTRAMUSCULAR | Status: DC | PRN
  Administered 2022-08-13: 10 mg via INTRAVENOUS

## 2022-08-13 MED ORDER — PROPOFOL 1000 MG/100ML IV EMUL
INTRAVENOUS | Status: AC
  Filled 2022-08-13: qty 100

## 2022-08-13 MED ORDER — THROMBIN 5000 UNITS EX SOLR
CUTANEOUS | Status: AC
  Filled 2022-08-13: qty 5000

## 2022-08-13 MED ORDER — ALUM & MAG HYDROXIDE-SIMETH 200-200-20 MG/5ML PO SUSP: 30.0000 mL | Freq: Four times a day (QID) | ORAL | Status: DC | PRN

## 2022-08-13 MED ORDER — DOCUSATE SODIUM 100 MG PO CAPS
100.0000 mg | ORAL_CAPSULE | Freq: Two times a day (BID) | ORAL | Status: DC
  Administered 2022-08-13 – 2022-08-17 (×8): 100 mg via ORAL
  Filled 2022-08-13 (×7): qty 1

## 2022-08-13 MED ORDER — HYDROMORPHONE HCL 1 MG/ML IJ SOLN: 0.2500 mg | INTRAMUSCULAR | Status: DC | PRN

## 2022-08-13 MED ORDER — ROCURONIUM BROMIDE 10 MG/ML (PF) SYRINGE
PREFILLED_SYRINGE | INTRAVENOUS | Status: AC
  Filled 2022-08-13: qty 10

## 2022-08-13 MED ORDER — LIDOCAINE-EPINEPHRINE 1 %-1:100000 IJ SOLN
INTRAMUSCULAR | Status: AC
  Filled 2022-08-13: qty 1

## 2022-08-13 MED ORDER — FENTANYL CITRATE (PF) 250 MCG/5ML IJ SOLN
INTRAMUSCULAR | Status: AC
  Filled 2022-08-13: qty 5

## 2022-08-13 MED ORDER — ACETAMINOPHEN 10 MG/ML IV SOLN
INTRAVENOUS | Status: DC | PRN
  Administered 2022-08-13: 1000 mg via INTRAVENOUS

## 2022-08-13 MED ORDER — SODIUM CHLORIDE 0.9% FLUSH: 3.0000 mL | INTRAVENOUS | Status: DC | PRN

## 2022-08-13 MED ORDER — ROCURONIUM BROMIDE 10 MG/ML (PF) SYRINGE
PREFILLED_SYRINGE | INTRAVENOUS | Status: DC | PRN
  Administered 2022-08-13: 10 mg via INTRAVENOUS
  Administered 2022-08-13: 30 mg via INTRAVENOUS
  Administered 2022-08-13: 70 mg via INTRAVENOUS
  Administered 2022-08-13 (×2): 10 mg via INTRAVENOUS
  Administered 2022-08-13: 20 mg via INTRAVENOUS
  Administered 2022-08-13: 10 mg via INTRAVENOUS
  Administered 2022-08-13: 20 mg via INTRAVENOUS

## 2022-08-13 MED ORDER — SODIUM CHLORIDE 0.9 % IV SOLN
250.0000 mL | INTRAVENOUS | Status: DC
  Administered 2022-08-14: 250 mL via INTRAVENOUS

## 2022-08-13 MED ORDER — CHLORHEXIDINE GLUCONATE 0.12 % MT SOLN
OROMUCOSAL | Status: AC
  Filled 2022-08-13: qty 15

## 2022-08-13 MED ORDER — BUPIVACAINE HCL (PF) 0.5 % IJ SOLN
INTRAMUSCULAR | Status: DC | PRN
  Administered 2022-08-13: 15 mL

## 2022-08-13 MED ORDER — PHENOL 1.4 % MT LIQD: 1.0000 | OROMUCOSAL | Status: DC | PRN

## 2022-08-13 MED ORDER — THROMBIN 5000 UNITS EX SOLR: OROMUCOSAL | Status: DC | PRN

## 2022-08-13 MED ORDER — LACTATED RINGERS IV SOLN: INTRAVENOUS | Status: DC | PRN

## 2022-08-13 MED ORDER — LACTATED RINGERS IV SOLN: INTRAVENOUS | Status: DC

## 2022-08-13 MED ORDER — PROPOFOL 10 MG/ML IV BOLUS
INTRAVENOUS | Status: DC | PRN
  Administered 2022-08-13: 140 mg via INTRAVENOUS

## 2022-08-13 MED ORDER — ONDANSETRON HCL 4 MG PO TABS: 4.0000 mg | ORAL_TABLET | Freq: Four times a day (QID) | ORAL | Status: DC | PRN

## 2022-08-13 MED ORDER — MENTHOL 3 MG MT LOZG: 1.0000 | LOZENGE | OROMUCOSAL | Status: DC | PRN

## 2022-08-13 MED ORDER — FLEET ENEMA 7-19 GM/118ML RE ENEM: 1.0000 | ENEMA | Freq: Once | RECTAL | Status: DC | PRN

## 2022-08-13 MED ORDER — PROPOFOL 10 MG/ML IV BOLUS
INTRAVENOUS | Status: AC
  Filled 2022-08-13: qty 20

## 2022-08-13 MED ORDER — CEFAZOLIN SODIUM 1 G IJ SOLR
INTRAMUSCULAR | Status: AC
  Filled 2022-08-13: qty 50

## 2022-08-13 MED ORDER — HYDROMORPHONE HCL 1 MG/ML IJ SOLN
INTRAMUSCULAR | Status: DC | PRN
  Administered 2022-08-13: .5 mg via INTRAVENOUS
  Administered 2022-08-13 (×2): .25 mg via INTRAVENOUS

## 2022-08-13 MED ORDER — LIDOCAINE 2% (20 MG/ML) 5 ML SYRINGE
INTRAMUSCULAR | Status: DC | PRN
  Administered 2022-08-13: 60 mg via INTRAVENOUS

## 2022-08-13 MED ORDER — 0.9 % SODIUM CHLORIDE (POUR BTL) OPTIME
TOPICAL | Status: DC | PRN
  Administered 2022-08-13: 1000 mL

## 2022-08-13 MED ORDER — ALBUMIN HUMAN 5 % IV SOLN: INTRAVENOUS | Status: DC | PRN

## 2022-08-13 MED ORDER — BISACODYL 10 MG RE SUPP: 10.0000 mg | Freq: Every day | RECTAL | Status: DC | PRN

## 2022-08-13 MED ORDER — KETAMINE HCL 10 MG/ML IJ SOLN
INTRAMUSCULAR | Status: DC | PRN
  Administered 2022-08-13 (×2): 10 mg via INTRAVENOUS
  Administered 2022-08-13: 20 mg via INTRAVENOUS
  Administered 2022-08-13: 10 mg via INTRAVENOUS

## 2022-08-13 MED ORDER — FENTANYL CITRATE (PF) 250 MCG/5ML IJ SOLN
INTRAMUSCULAR | Status: DC | PRN
  Administered 2022-08-13: 100 ug via INTRAVENOUS
  Administered 2022-08-13 (×3): 50 ug via INTRAVENOUS

## 2022-08-13 MED ORDER — SENNA 8.6 MG PO TABS
1.0000 | ORAL_TABLET | Freq: Two times a day (BID) | ORAL | Status: DC
  Administered 2022-08-13 – 2022-08-16 (×6): 8.6 mg via ORAL
  Filled 2022-08-13 (×4): qty 1

## 2022-08-13 SURGICAL SUPPLY — 87 items
ADH SKN CLS APL DERMABOND .7 (GAUZE/BANDAGES/DRESSINGS) ×2
ADH SKN CLS LQ APL DERMABOND (GAUZE/BANDAGES/DRESSINGS) ×6
APL SRG 60D 8 XTD TIP BNDBL (TIP)
BAG COUNTER SPONGE SURGICOUNT (BAG) ×2 IMPLANT
BAG SPNG CNTER NS LX DISP (BAG) ×2
BASKET BONE COLLECTION (BASKET) ×2 IMPLANT
BLADE BONE MILL MEDIUM (MISCELLANEOUS) ×2 IMPLANT
BLADE CLIPPER SURG (BLADE) IMPLANT
BONE CHIP PRESERV 40CC PCAN1/2 (Bone Implant) ×2 IMPLANT
BUR 14 MATCH 3 (BUR) IMPLANT
BUR MATCHSTICK NEURO 3.0 LAGG (BURR) ×2 IMPLANT
BUR MR8 14 BALL 5 (BUR) IMPLANT
BURR 14 MATCH 3 (BUR)
BURR MR8 14 BALL 5 (BUR)
CAGE COROENT LG 10X9X23-12 (Cage) IMPLANT
CANISTER SUCT 3000ML PPV (MISCELLANEOUS) ×2 IMPLANT
CEMENT KYPHON C01A KIT/MIXER (Cement) IMPLANT
CNTNR URN SCR LID CUP LEK RST (MISCELLANEOUS) ×2 IMPLANT
CONT SPEC 4OZ STRL OR WHT (MISCELLANEOUS) ×2
COVER BACK TABLE 60X90IN (DRAPES) ×2 IMPLANT
COVERAGE SUPPORT O-ARM STEALTH (MISCELLANEOUS) ×2 IMPLANT
DERMABOND ADVANCED .7 DNX12 (GAUZE/BANDAGES/DRESSINGS) ×2 IMPLANT
DERMABOND ADVANCED .7 DNX6 (GAUZE/BANDAGES/DRESSINGS) IMPLANT
DEVICE DISSECT PLASMABLAD 3.0S (MISCELLANEOUS) ×2 IMPLANT
DIGITIZER BENDINI (MISCELLANEOUS) IMPLANT
DRAPE C-ARM 42X72 X-RAY (DRAPES) ×4 IMPLANT
DRAPE HALF SHEET 40X57 (DRAPES) IMPLANT
DRAPE LAPAROTOMY 100X72X124 (DRAPES) ×2 IMPLANT
DRAPE SHEET LG 3/4 BI-LAMINATE (DRAPES) ×8 IMPLANT
DURAPREP 26ML APPLICATOR (WOUND CARE) ×2 IMPLANT
DURASEAL APPLICATOR TIP (TIP) IMPLANT
DURASEAL SPINE SEALANT 3ML (MISCELLANEOUS) IMPLANT
ELECT REM PT RETURN 9FT ADLT (ELECTROSURGICAL) ×2
ELECTRODE REM PT RTRN 9FT ADLT (ELECTROSURGICAL) ×2 IMPLANT
FEE COVERAGE SUPPORT O-ARM (MISCELLANEOUS) ×2 IMPLANT
GAUZE 4X4 16PLY ~~LOC~~+RFID DBL (SPONGE) IMPLANT
GAUZE SPONGE 4X4 12PLY STRL (GAUZE/BANDAGES/DRESSINGS) ×2 IMPLANT
GLOVE BIOGEL PI IND STRL 8.5 (GLOVE) ×4 IMPLANT
GLOVE ECLIPSE 8.0 STRL XLNG CF (GLOVE) ×2 IMPLANT
GLOVE ECLIPSE 8.5 STRL (GLOVE) ×4 IMPLANT
GOWN STRL REUS W/ TWL LRG LVL3 (GOWN DISPOSABLE) IMPLANT
GOWN STRL REUS W/ TWL XL LVL3 (GOWN DISPOSABLE) IMPLANT
GOWN STRL REUS W/TWL 2XL LVL3 (GOWN DISPOSABLE) ×4 IMPLANT
GOWN STRL REUS W/TWL LRG LVL3 (GOWN DISPOSABLE)
GOWN STRL REUS W/TWL XL LVL3 (GOWN DISPOSABLE)
GRAFT BNE CANC CHIPS 1-8 40CC (Bone Implant) IMPLANT
GRAFT BONE PROTEIOS XL 10CC (Orthopedic Implant) IMPLANT
HEMOSTAT POWDER KIT SURGIFOAM (HEMOSTASIS) ×2 IMPLANT
KIT BASIN OR (CUSTOM PROCEDURE TRAY) ×2 IMPLANT
KIT GRAFTMAG DEL NEURO DISP (NEUROSURGERY SUPPLIES) IMPLANT
KIT TURNOVER KIT B (KITS) ×2 IMPLANT
MARKER SPHERE PSV REFLC NDI (MISCELLANEOUS) ×10 IMPLANT
MILL BONE PREP (MISCELLANEOUS) ×2 IMPLANT
NDL SPNL 18GX3.5 QUINCKE PK (NEEDLE) IMPLANT
NEEDLE HYPO 22GX1.5 SAFETY (NEEDLE) ×2 IMPLANT
NEEDLE SPNL 18GX3.5 QUINCKE PK (NEEDLE) IMPLANT
NS IRRIG 1000ML POUR BTL (IV SOLUTION) ×2 IMPLANT
PACK LAMINECTOMY NEURO (CUSTOM PROCEDURE TRAY) ×2 IMPLANT
PAD ARMBOARD 7.5X6 YLW CONV (MISCELLANEOUS) ×6 IMPLANT
PATTIES SURGICAL .5 X1 (DISPOSABLE) ×2 IMPLANT
PLASMABLADE 3.0S (MISCELLANEOUS) ×2
PUSHER RELINE FENS MAS (MISCELLANEOUS) IMPLANT
RELINE MAS NDL FENS (NEEDLE) IMPLANT
RELINE MAS NEEDLE FENS (NEEDLE) ×16 IMPLANT
ROD RELINE MAS ST 5.5X300MM (Rod) IMPLANT
SCREW LOCK RELINE 5.5 TULIP (Screw) IMPLANT
SCREW RELINE PA 6.5X45 (Screw) IMPLANT
SCREW RELINE-O 8.5X90MM POLY (Screw) IMPLANT
SOL ELECTROSURG ANTI STICK (MISCELLANEOUS) ×4
SOLUTION ELECTROSURG ANTI STCK (MISCELLANEOUS) ×4 IMPLANT
SPIKE FLUID TRANSFER (MISCELLANEOUS) ×2 IMPLANT
SPONGE SURGIFOAM ABS GEL 100 (HEMOSTASIS) IMPLANT
SPONGE T-LAP 4X18 ~~LOC~~+RFID (SPONGE) IMPLANT
SUT PROLENE 6 0 BV (SUTURE) IMPLANT
SUT VIC AB 1 CT1 18XBRD ANBCTR (SUTURE) ×2 IMPLANT
SUT VIC AB 1 CT1 8-18 (SUTURE) ×2
SUT VIC AB 2-0 CP2 18 (SUTURE) ×2 IMPLANT
SUT VIC AB 3-0 SH 8-18 (SUTURE) ×2 IMPLANT
SUT VIC AB 4-0 RB1 18 (SUTURE) ×2 IMPLANT
SYR 3ML LL SCALE MARK (SYRINGE) ×8 IMPLANT
SYR 5ML LL (SYRINGE) IMPLANT
TAPE CLOTH SURG 4X10 WHT LF (GAUZE/BANDAGES/DRESSINGS) IMPLANT
TIP FENS REPLACE RELINE (MISCELLANEOUS) IMPLANT
TOWEL GREEN STERILE (TOWEL DISPOSABLE) ×2 IMPLANT
TOWEL GREEN STERILE FF (TOWEL DISPOSABLE) ×2 IMPLANT
TRAY FOLEY MTR SLVR 16FR STAT (SET/KITS/TRAYS/PACK) ×2 IMPLANT
WATER STERILE IRR 1000ML POUR (IV SOLUTION) ×2 IMPLANT

## 2022-08-13 NOTE — Progress Notes (Signed)
Patient ID: Larry Holloway, male   DOB: [birth year], 72 y.o.   MRN: [id number] Patient updated and ready for surgery.

## 2022-08-13 NOTE — Anesthesia Postprocedure Evaluation (Signed)
Anesthesia Post Note  Patient: Larry Holloway  Procedure(s) Performed: Lumbar four-five Lumbar five-Sacral one Posterior Lumbar Interbody Fusion w/Augmented Pedicle screw fixation from Lumbar one to Sacral one with OArm, stealth (Back) Application of O-Arm     Patient location during evaluation: PACU Anesthesia Type: General Level of consciousness: awake and alert Pain management: pain level controlled Vital Signs Assessment: post-procedure vital signs reviewed and stable Respiratory status: spontaneous breathing, nonlabored ventilation, respiratory function stable and patient connected to nasal cannula oxygen Cardiovascular status: blood pressure returned to baseline and stable Postop Assessment: no apparent nausea or vomiting Anesthetic complications: no   No notable events documented.  Last Vitals:  Vitals:   08/13/22 2000 08/13/22 2015  BP: (!) 147/82 (!) 140/87  Pulse: 94 92  Resp: 16 14  Temp:    SpO2: 94% 96%    Last Pain:  Vitals:   08/13/22 2000  TempSrc:   PainSc: 0-No pain                 Beryle Lathe

## 2022-08-13 NOTE — Anesthesia Procedure Notes (Signed)
Arterial Line Insertion Start/End5/16/2024 11:40 AM, 08/13/2022 11:42 AM Performed by: Lelon Perla, CRNA, CRNA  Patient location: Pre-op. Preanesthetic checklist: patient identified, IV checked, site marked, risks and benefits discussed, surgical consent, monitors and equipment checked, pre-op evaluation, timeout performed and anesthesia consent Left, radial was placed Catheter size: 20 G Hand hygiene performed  and maximum sterile barriers used   Attempts: 1 Procedure performed without using ultrasound guided technique. Following insertion, dressing applied and Biopatch. Post procedure assessment: normal and unchanged  Patient tolerated the procedure well with no immediate complications.

## 2022-08-13 NOTE — Anesthesia Procedure Notes (Signed)
Procedure Name: Intubation Date/Time: 08/13/2022 11:38 AM  Performed by: Lelon Perla, CRNAPre-anesthesia Checklist: Patient identified, Emergency Drugs available, Suction available and Patient being monitored Patient Re-evaluated:Patient Re-evaluated prior to induction Oxygen Delivery Method: Circle system utilized Preoxygenation: Pre-oxygenation with 100% oxygen Induction Type: IV induction Ventilation: Mask ventilation without difficulty Laryngoscope Size: Mac and 4 Grade View: Grade I Tube type: Oral Number of attempts: 1 Airway Equipment and Method: Stylet and Oral airway Placement Confirmation: ETT inserted through vocal cords under direct vision, positive ETCO2 and breath sounds checked- equal and bilateral Tube secured with: Tape Dental Injury: Teeth and Oropharynx as per pre-operative assessment

## 2022-08-13 NOTE — Op Note (Signed)
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 Postoperative diagnosis same Procedure: Posterior decompression L4-5 and L5-S1 with posterior lumbar interbody arthrodesis L5-S1 posterolateral arthrodesis L4-5 and L5-S1 image-guided assistance in placing pedicle screws from L1-S1 with placement of S2 AI screw on the left.  Arthrodesis with autograft allograft and Prody os L4-5 L5-S1 Surgeon: Barnett Abu First Assistant: Monia Pouch, DO Anesthesia: General endotracheal Indications: Larry Holloway has been involving significant degenerative changes with spondylolisthesis and scoliosis of the lumbar spine has been treated conservatively but was advised regarding the need for surgical decompression and stabilization from L1-S1 with fixation to the pelvis.  His procedure was done in a staged fashion with anterolateral decompression at L1-2 334 and 4 5 today he is undergoing posterior decompression and the canal at L4-5 and L5-S1 with posterior interbody arthrodesis at L5-S1 pedicle fixation using image-guided technique with the O-arm L1-S1 with pelvic fixation on the left side  Procedure: The patient was brought to the operating room supine on the stretcher.  After the smooth induction of general tracheal anesthesia he was carefully turned prone onto the [facility] table.  The bony prominences were appropriately padded and protected.  Then a midline incision was created after prepping the skin in the lumbar spine with alcohol DuraPrep and draping in a sterile fashion.  The dissection was carried down to the lumbodorsal fascia and the first identifiable spinous process with the noted to be that of L4.  L4-5 and L5-S1 interspaces were then cleared and the dissection was carried out laterally there was marked facet hypertrophy particularly worse on the right side at L4-5  and bilaterally at L5-S1.  The facet joints were taken down at L5-S1 and the interlaminar space was opened.  This allowed for exploration of the common dural tube and the lateral recesses particularly the path of the L5 nerve root which was identified first on the left side than on the right side this was carefully protected and is travel at the lateral recess.  Then the disc base was isolated and a discectomy was performed at L5-S1 removing a substantial quantity of severely degenerated desiccated disc material.  This yielded some loosening of the interspace so that an interbody spacer could be placed measuring 12 mm in height with 12 degrees of lordosis at the L5-S1 space.  To this the interspace was packed with a combination of autograft allograft and Prody os for a total volume of 16 cc.  Then the spacers were placed.  Care was taken to make sure that the L5 nerve roots and the S1 nerve roots were decompressed well in this region then laminotomies were created particularly on the right side at L4-5 where there is substantial facet hypertrophy the path of the right L4 and the L5 nerve roots were well decompressed.  The right side was cleared and the anterior facet space to allow placement of bone graft in this region.  At this point a clamp was placed on the spinous process of L4 to place the registration arm for the O-arm.  Then a spin was done cephalad to identify from L1 down to L4 and a second spin was done inferiorly to identify from L4 down to the pelvis.  Then using the image guidance we placed pedicle screws measuring 6.5 x 45 mm and L1-L2 L3-L4-L5 and S1.  Using the image guidance technique we then attempted to place  S2 AI screws and we could only achieve this on the patient's right side secondary to her Ference from the towers on the screws that were placed from L1-S1.  A second spin was performed.  This identified some medialization of the L1 and the L5 screws on the left side.  The screws were removed  and the screws were redrilled.  Because of the patient's age and bone quality it was decided to use methacrylate augmentation and the screws felt appropriate to do so.  The L4 screws were noted to be somewhat short and it was not felt that the screws would be appropriate for placement of methacrylate and methacrylate was then placed in the L1-L2 and L3 screws on the right L5 was omitted and the sacral screw was augmented on the right side the sacral screw was also augmented on the left side along with the L2 and L3 screws on the left side.  Because of the repositioning L1 and L5 was not augmented.  Bend the knee was then used to contour rods in a neutral construct from L1-S1 on the right side and from L1 to the S2 AI screw on the left side.  This was then fixed rigidly.  Final radiographs were obtained in the AP and lateral projection.  During the procedure Dr. Karie Mainland helped extensively with the hardware placement particularly with his image guidance expertise in help with placement of the S2 AI screws.  Also help with the contouring of the rod and placement of the rod to secure the fixation from L1 to the S2 AI screw on the left side and the right-sided fixation.  Once completed the area was inspected carefully the common dural tube was noted to be well decompressed from L4-5 and L5-S1 lateral gutters were packed with the remainder of bone and the intertransverse space at L4-5 and L5-S1.  The retractors were all removed after careful inspection and the lumbodorsal fascia was closed with #1 Vicryl in interrupted fashion 15 cc of half percent Marcaine was injected into the paraspinous tissues 2-0 Vicryl and 3-0 Vicryl was used in the subcuticular tissues Dermabond was placed on the skin.  Blood loss was estimated about 600 cc 150 cc of Cell Saver blood was returned to the patient.  Patient was then returned to recovery room in stable condition.

## 2022-08-13 NOTE — Transfer of Care (Signed)
Immediate Anesthesia Transfer of Care Note  Patient: Larry Holloway  Procedure(s) Performed: Lumbar four-five Lumbar five-Sacral one Posterior Lumbar Interbody Fusion w/Augmented Pedicle screw fixation from Lumbar one to Sacral one with OArm, stealth (Back) Application of O-Arm  Patient Location: PACU  Anesthesia Type:General  Level of Consciousness: awake, drowsy, and patient cooperative  Airway & Oxygen Therapy: Patient Spontanous Breathing and Patient connected to face mask oxygen  Post-op Assessment: Report given to RN and Post -op Vital signs reviewed and stable  Post vital signs: Reviewed and stable  Last Vitals:  Vitals Value Taken Time  BP 143/72 08/13/22 1937  Temp    Pulse 92 08/13/22 1940  Resp 16 08/13/22 1940  SpO2 97 % 08/13/22 1940  Vitals shown include unvalidated device data.  Last Pain:  Vitals:   08/13/22 0951  TempSrc: Oral  PainSc:       Patients Stated Pain Goal: 2 (08/13/22 0145)  Complications: No notable events documented.

## 2022-08-14 ENCOUNTER — Other Ambulatory Visit: Payer: Self-pay

## 2022-08-14 LAB — BASIC METABOLIC PANEL
Anion gap: 8 (ref 5–15)
BUN: 14 mg/dL (ref 8–23)
CO2: 26 mmol/L (ref 22–32)
Calcium: 7.9 mg/dL — ABNORMAL LOW (ref 8.9–10.3)
Chloride: 94 mmol/L — ABNORMAL LOW (ref 98–111)
Creatinine, Ser: 0.77 mg/dL (ref 0.61–1.24)
GFR, Estimated: >60 mL/min (ref >60)
Glucose, Bld: 166 mg/dL — ABNORMAL HIGH (ref 70–99)
Potassium: 3.8 mmol/L (ref 3.5–5.1)
Sodium: 128 mmol/L — ABNORMAL LOW (ref 135–145)

## 2022-08-14 LAB — CBC
HCT: 26.7 % — ABNORMAL LOW (ref 39.0–52.0)
Hemoglobin: 9.1 g/dL — ABNORMAL LOW (ref 13.0–17.0)
MCH: 31.3 pg (ref 26.0–34.0)
MCHC: 34.1 g/dL (ref 30.0–36.0)
MCV: 91.8 fL (ref 80.0–100.0)
Platelets: 237 10*3/uL (ref 150–400)
RBC: 2.91 MIL/uL — ABNORMAL LOW (ref 4.22–5.81)
RDW: 14.4 % (ref 11.5–15.5)
WBC: 9.3 10*3/uL (ref 4.0–10.5)
nRBC: 0 % (ref 0.0–0.2)

## 2022-08-14 NOTE — Evaluation (Signed)
Physical Therapy Re-Evaluation Patient Details Name: Larry Holloway MRN: [id number] DOB: February 25, 1951 Today's Date: 08/14/2022  History of Present Illness  72 yo M adm for planned back sx. 5/13 anterolateral decompression at L1 2-3 and 3 4, and 5/16 posterior decompression and fusion L4 to the sacrum. PMH includes: Arthritis Back, Cancer of prostate, High blood pressure, and Pre-diabetes.  Clinical Impression  Patient now s/p second stage of surgery with L4 to sacrum PLIF.  Patient able to mobilize up with assistance and used RW for short hallway ambulation.  Limited by pain, decreased recall of precautions and events prior to second surgery (like practicing stairs) and flexed posture with pain.  Still should progress to return home, though may benefit from initial HHPT at d/c.  PT will continue to follow.        Recommendations for follow up therapy are one component of a multi-disciplinary discharge planning process, led by the attending physician.  Recommendations may be updated based on patient status, additional functional criteria and insurance authorization.  Follow Up Recommendations       Assistance Recommended at Discharge Intermittent Supervision/Assistance  Patient can return home with the following  A little help with walking and/or transfers;Assistance with cooking/housework;Assist for transportation;Help with stairs or ramp for entrance    Equipment Recommendations Rolling walker (2 wheels);BSC/3in1  Recommendations for Other Services       Functional Status Assessment       Precautions / Restrictions Precautions Precautions: Fall;Back Precaution Comments: back precautions reviewed, pt did not recall Required Braces or Orthoses: Spinal Brace Spinal Brace: Lumbar corset;Applied in sitting position      Mobility  Bed Mobility Overal bed mobility: Needs Assistance Bed Mobility: Rolling, Sidelying to Sit Rolling: Min assist Sidelying to sit: Mod assist        General bed mobility comments: assist for lifting trunk and scooting hips    Transfers Overall transfer level: Needs assistance Equipment used: Rolling walker (2 wheels) Transfers: Sit to/from Stand Sit to Stand: Min assist, From elevated surface           General transfer comment: cues for hand placement, assist for balance and some for lifting even with bed height raised    Ambulation/Gait Ambulation/Gait assistance: Min assist, Min guard Gait Distance (Feet): 90 Feet Assistive device: Rolling walker (2 wheels) Gait Pattern/deviations: Step-to pattern, Step-through pattern, Decreased stride length, Wide base of support, Trunk flexed       General Gait Details: audible crepitus L hip pt relates has "bone on bone" arthritis; cues for posture, sequencing with RW  Stairs            Wheelchair Mobility    Modified Rankin (Stroke Patients Only)       Balance Overall balance assessment: Needs assistance Sitting-balance support: Feet supported Sitting balance-Leahy Scale: Good     Standing balance support: Reliant on assistive device for balance, Bilateral upper extremity supported Standing balance-Leahy Scale: Poor Standing balance comment: reliant on UE support                             Pertinent Vitals/Pain Pain Assessment Pain Location: back Pain Descriptors / Indicators: Aching, Operative site guarding, Sore Pain Intervention(s): Monitored during session, Repositioned, Premedicated before session    Home Living Family/patient expects to be discharged to:: Private residence Living Arrangements: Spouse/significant other;Children Available Help at Discharge: Family;Available 24 hours/day Type of Home: House Home Access: Stairs to enter Entrance Stairs-Rails: Right Entrance  Stairs-Number of Steps: 3-4 Alternate Level Stairs-Number of Steps: 13 Home Layout: Two level;Bed/bath upstairs Home Equipment: None      Prior Function Prior Level  of Function : Independent/Modified Independent;Driving               ADLs Comments: works as Orthoptist at [employer]     Hand Dominance   Dominant Hand: Right    Extremity/Trunk Assessment   Upper Extremity Assessment Upper Extremity Assessment: Defer to OT evaluation    Lower Extremity Assessment Lower Extremity Assessment: Generalized weakness;LLE deficits/detail LLE Deficits / Details: crepitus in hip with weight bearing    Cervical / Trunk Assessment Cervical / Trunk Assessment: Back Surgery  Communication   Communication: No difficulties  Cognition Arousal/Alertness: Awake/alert Behavior During Therapy: WFL for tasks assessed/performed Overall Cognitive Status: Impaired/Different from baseline Area of Impairment: Safety/judgement, Memory                     Memory: Decreased recall of precautions, Decreased short-term memory   Safety/Judgement: Decreased awareness of safety     General Comments: did not recall back precautions and took a while to remember he had practiced stairs prior to second surgery        General Comments General comments (skin integrity, edema, etc.): wife in the room and brother and mother arrived during session.  Will have assistance at d/c.    Exercises     Assessment/Plan    PT Assessment    PT Problem List         PT Treatment Interventions      PT Goals (Current goals can be found in the Care Plan section)       Frequency Min 5X/week     Co-evaluation               AM-PAC PT "6 Clicks" Mobility  Outcome Measure Help needed turning from your back to your side while in a flat bed without using bedrails?: A Little Help needed moving from lying on your back to sitting on the side of a flat bed without using bedrails?: A Little Help needed moving to and from a bed to a chair (including a wheelchair)?: A Little Help needed standing up from a chair using your arms (e.g., wheelchair or bedside chair)?: A  Little Help needed to walk in hospital room?: A Little Help needed climbing 3-5 steps with a railing? : Total 6 Click Score: 16    End of Session Equipment Utilized During Treatment: Gait belt;Back brace Activity Tolerance: Patient limited by pain Patient left: in chair;with call bell/phone within reach;with family/visitor present   PT Visit Diagnosis: Unsteadiness on feet (R26.81);Pain    Time: 9147-8295 PT Time Calculation (min) (ACUTE ONLY): 26 min   Charges:     PT Treatments $Therapeutic Activity: 8-22 mins        Sheran Lawless, PT Acute Rehabilitation Services Office:[phone] 08/14/2022   Elray Mcgregor 08/14/2022, 11:25 AM

## 2022-08-14 NOTE — Progress Notes (Signed)
Occupational Therapy Treatment Patient Details Name: Larry Holloway MRN: [id number] DOB: 01-Aug-1950 Today's Date: 08/14/2022   History of present illness 72 yo M adm for planned back sx. 5/13 anterolateral decompression at L1 2-3 and 3 4, and 5/16 posterior decompression and fusion L4 to the sacrum. PMH includes: Arthritis Back, Cancer of prostate, High blood pressure, and Pre-diabetes.   OT comments  Pt progressing towards OT goals this session, after second sx requiring more assist. Focued on don/doff of brace with min A, education for LB ADL including demo and teach back with AE kit, min A for transfers with RW, in room mobility at min A. Min A for bed mobility with cues and demo for log roll technique. OT will continue to follow acutely. Changed dc recommendations to HHOT post-acute. Family plans on purchasing AE (LLE is the "trouble" leg)   Recommendations for follow up therapy are one component of a multi-disciplinary discharge planning process, led by the attending physician.  Recommendations may be updated based on patient status, additional functional criteria and insurance authorization.    Assistance Recommended at Discharge Intermittent Supervision/Assistance  Patient can return home with the following  Assist for transportation   Equipment Recommendations  BSC/3in1    Recommendations for Other Services      Precautions / Restrictions Precautions Precautions: Fall;Back Precaution Booklet Issued: Yes (comment) Precaution Comments: back precautions reviewed, pt did not recall Required Braces or Orthoses: Spinal Brace Spinal Brace: Lumbar corset;Applied in sitting position Restrictions Weight Bearing Restrictions: No       Mobility Bed Mobility Overal bed mobility: Needs Assistance Bed Mobility: Rolling, Sit to Sidelying Rolling: Min assist       Sit to sidelying: Min assist General bed mobility comments: minA for BLE into bed, reviewed sequencing prior to  attempting and Pt verbalized it went much better than previous attempts    Transfers Overall transfer level: Needs assistance Equipment used: Rolling walker (2 wheels) Transfers: Sit to/from Stand Sit to Stand: Min assist, From elevated surface           General transfer comment: cues for hand placement, assist for balance and some for lifting     Balance Overall balance assessment: Needs assistance Sitting-balance support: Feet supported Sitting balance-Leahy Scale: Good     Standing balance support: Reliant on assistive device for balance, Bilateral upper extremity supported Standing balance-Leahy Scale: Poor Standing balance comment: reliant on UE support                           ADL either performed or assessed with clinical judgement   ADL Overall ADL's : Needs assistance/impaired             Lower Body Bathing: Min guard;Sitting/lateral leans Lower Body Bathing Details (indicate cue type and reason): educated in long handle sponge Upper Body Dressing : Moderate assistance Upper Body Dressing Details (indicate cue type and reason): educated on donning/doffing back brace Lower Body Dressing: Set up;With adaptive equipment Lower Body Dressing Details (indicate cue type and reason): educated, demonstrated, teach back of grabber/reacher, sock donner, long handle shoe horn Toilet Transfer: Minimal assistance;Min guard;Ambulation;Rolling walker (2 wheels) Toilet Transfer Details (indicate cue type and reason): simulated through transfer chair>bed Toileting- Clothing Manipulation and Hygiene: Minimal assistance;With adaptive equipment;Adhering to back precautions;Sit to/from stand Toileting - Clothing Manipulation Details (indicate cue type and reason): educted on toilet aide as well as ok to come from the front as lomg and he monitored bending -  Pt also has a bidet at home     Functional mobility during ADLs: Min guard;Minimal assistance;Rolling walker (2  wheels);Cueing for sequencing;Cueing for safety General ADL Comments: focued on AE education, Pt LLE a "trouble spot" that requires extra assist, educated in sequencing for dressing    Extremity/Trunk Assessment Upper Extremity Assessment Upper Extremity Assessment: Overall WFL for tasks assessed   Lower Extremity Assessment Lower Extremity Assessment: Defer to PT evaluation LLE Deficits / Details: crepitus in hip with weight bearing   Cervical / Trunk Assessment Cervical / Trunk Assessment: Back Surgery    Vision       Perception Perception Perception: Within Functional Limits   Praxis Praxis Praxis: Intact    Cognition Arousal/Alertness: Awake/alert Behavior During Therapy: WFL for tasks assessed/performed Overall Cognitive Status: Impaired/Different from baseline Area of Impairment: Safety/judgement, Memory                     Memory: Decreased recall of precautions, Decreased short-term memory   Safety/Judgement: Decreased awareness of safety     General Comments: able to recall back precautions after previous PT session earlier today, vc for safety throughout in functional context        Exercises      Shoulder Instructions       General Comments family present throughout session and recieved all education    Pertinent Vitals/ Pain       Pain Assessment Pain Assessment: 0-10 Pain Score: 5  Pain Location: back Pain Descriptors / Indicators: Aching, Operative site guarding, Sore Pain Intervention(s): Monitored during session, Repositioned, RN gave pain meds during session  Home Living Family/patient expects to be discharged to:: Private residence Living Arrangements: Spouse/significant other;Children Available Help at Discharge: Family;Available 24 hours/day Type of Home: House Home Access: Stairs to enter [employer] of Steps: 3-4 Entrance Stairs-Rails: Right Home Layout: Two level;Bed/bath upstairs Alternate Level Stairs-Number  of Steps: 13 Alternate Level Stairs-Rails: Right;Left;Can reach both Bathroom Shower/Tub: Tub/shower unit;Walk-in shower   Bathroom Toilet: Standard Bathroom Accessibility: Yes   Home Equipment: None          Prior Functioning/Environment              Frequency  Min 2X/week        Progress Toward Goals  OT Goals(current goals can now be found in the care plan section)  Progress towards OT goals: Progressing toward goals  Acute Rehab OT Goals Patient Stated Goal: return home and be safe OT Goal Formulation: With patient/family Time For Goal Achievement: 08/25/22 Potential to Achieve Goals: Good ADL Goals Pt Will Perform Grooming: with modified independence;standing Pt Will Perform Lower Body Dressing: with modified independence;sit to/from stand Pt Will Transfer to Toilet: Independently;ambulating;regular height toilet  Plan Discharge plan needs to be updated    Co-evaluation                 AM-PAC OT "6 Clicks" Daily Activity     Outcome Measure   Help from another person eating meals?: None Help from another person taking care of personal grooming?: None Help from another person toileting, which includes using toliet, bedpan, or urinal?: A Little Help from another person bathing (including washing, rinsing, drying)?: A Little Help from another person to put on and taking off regular upper body clothing?: None Help from another person to put on and taking off regular lower body clothing?: A Little 6 Click Score: 21    End of Session Equipment Utilized During Treatment: Rolling walker (2  wheels);Back brace  OT Visit Diagnosis: Unsteadiness on feet (R26.81)   Activity Tolerance Patient tolerated treatment well   Patient Left in bed;with call bell/phone within reach   Nurse Communication Mobility status        Time: 1610-9604 OT Time Calculation (min): 28 min  Charges: OT General Charges $OT Visit: 1 Visit OT Treatments $Self Care/Home  Management : 23-37 mins Nyoka Cowden OTR/L Acute Rehabilitation Services Office: [phone]  Evern Bio [facility] 08/14/2022, 2:05 PM

## 2022-08-14 NOTE — Progress Notes (Signed)
Patient ID: Larry Holloway, male   DOB: 1950-12-14, 72 y.o.   MRN: [id number] Vital signs stable Back sore but pain under good control  Dressing dry  Mobilizing Post op hct 26 glc 160 renal function is good.  Monitor post op

## 2022-08-14 NOTE — Care Management Important Message (Signed)
Important Message  Patient Details  Name: Larry Holloway MRN: [id number] Date of Birth: [birth year]   Medicare Important Message Given:  Yes     Sherilyn Banker 08/14/2022, 3:57 PM

## 2022-08-15 ENCOUNTER — Inpatient Hospital Stay (HOSPITAL_COMMUNITY): Payer: Medicare Other

## 2022-08-15 MED ORDER — MELATONIN 3 MG PO TABS
3.0000 mg | ORAL_TABLET | Freq: Every day | ORAL | Status: DC
  Administered 2022-08-15 – 2022-08-16 (×3): 3 mg via ORAL
  Filled 2022-08-15 (×3): qty 1

## 2022-08-15 NOTE — Progress Notes (Signed)
Patient ID: Larry Holloway, male   DOB: 1950/06/24, 72 y.o.   MRN: [id number] Signs are stable Patient is awake and alert notes that there is some moderate back soreness Dressing remains dry Mobilizing reasonably well has had some restless legs in his lower extremities we will continue to monitor his progress I will obtain an AP and lateral radiograph of his lumbar spine today.

## 2022-08-15 NOTE — Progress Notes (Signed)
Physical Therapy Treatment Patient Details Name: Larry Holloway MRN: [id number] DOB: Mar 29, 1951 Today's Date: 08/15/2022   History of Present Illness 72 yo M adm for planned back sx. 5/13 anterolateral decompression at L1 2-3 and 3 4, and 5/16 posterior decompression and fusion L4 to the sacrum. PMH includes: Arthritis Back, Cancer of prostate, High blood pressure, and Pre-diabetes.    PT Comments    Pt progressing well with post-op mobility. He was able to demonstrate transfers and ambulation with gross min guard assist to min assist and RW for support. Pt motivated to have a BM and ambulate this morning. Pt was educated on precautions, brace application/wearing schedule, appropriate activity progression, and car transfer. Will continue to follow.      Recommendations for follow up therapy are one component of a multi-disciplinary discharge planning process, led by the attending physician.  Recommendations may be updated based on patient status, additional functional criteria and insurance authorization.  Follow Up Recommendations       Assistance Recommended at Discharge Intermittent Supervision/Assistance  Patient can return home with the following A little help with walking and/or transfers;Assistance with cooking/housework;Assist for transportation;Help with stairs or ramp for entrance   Equipment Recommendations  Rolling walker (2 wheels);BSC/3in1    Recommendations for Other Services       Precautions / Restrictions Precautions Precautions: Fall;Back Precaution Booklet Issued: Yes (comment) Precaution Comments: Pt was cued for precautions during functional mobility. Required Braces or Orthoses: Spinal Brace Spinal Brace: Lumbar corset;Applied in sitting position Restrictions Weight Bearing Restrictions: No     Mobility  Bed Mobility Overal bed mobility: Needs Assistance Bed Mobility: Rolling, Sit to Sidelying Rolling: Supervision Sidelying to sit: Supervision        General bed mobility comments: Pt was able to progress to EOB without assistance. Increased time due to pain. HOB flat and rails lowered to simulate home environment.    Transfers Overall transfer level: Needs assistance Equipment used: Rolling walker (2 wheels) Transfers: Sit to/from Stand Sit to Stand: Min assist, Min guard           General transfer comment: VC's for hand placement on seated surface for safety. Min assist for power up to full stand.    Ambulation/Gait Ambulation/Gait assistance: Min guard Gait Distance (Feet): 400 Feet Assistive device: Rolling walker (2 wheels) Gait Pattern/deviations: Step-to pattern, Step-through pattern, Decreased stride length, Wide base of support, Trunk flexed Gait velocity: Decreased     General Gait Details: audible crepitus L hip pt relates has "bone on bone" arthritis but pt able to progress to step-through gait pattern. Pt cued throughout for improved posture, closer walker proximity, and forward gaze.   Stairs             Wheelchair Mobility    Modified Rankin (Stroke Patients Only)       Balance Overall balance assessment: Needs assistance Sitting-balance support: Feet supported Sitting balance-Leahy Scale: Good     Standing balance support: Reliant on assistive device for balance, Bilateral upper extremity supported Standing balance-Leahy Scale: Poor Standing balance comment: reliant on UE support                            Cognition Arousal/Alertness: Awake/alert Behavior During Therapy: WFL for tasks assessed/performed Overall Cognitive Status: Impaired/Different from baseline Area of Impairment: Safety/judgement, Memory                     Memory: Decreased recall of  precautions, Decreased short-term memory   Safety/Judgement: Decreased awareness of safety     General Comments: Confused regarding who saw him with therapy yesterday and what he was able to do during that  session        Exercises      General Comments        Pertinent Vitals/Pain Pain Assessment Pain Assessment: Faces Faces Pain Scale: Hurts little more Pain Location: back Pain Descriptors / Indicators: Aching, Operative site guarding, Sore Pain Intervention(s): Limited activity within patient's tolerance, Monitored during session, Repositioned    Home Living                          Prior Function            PT Goals (current goals can now be found in the care plan section) Acute Rehab PT Goals 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 PT Goal Formulation: With patient Time For Goal Achievement: 08/18/22 Potential to Achieve Goals: Good Progress towards PT goals: Progressing toward goals    Frequency    Min 5X/week      PT Plan Current plan remains appropriate    Co-evaluation              AM-PAC PT "6 Clicks" Mobility   Outcome Measure  Help needed turning from your back to your side while in a flat bed without using bedrails?: A Little Help needed moving from lying on your back to sitting on the side of a flat bed without using bedrails?: A Little Help needed moving to and from a bed to a chair (including a wheelchair)?: A Little Help needed standing up from a chair using your arms (e.g., wheelchair or bedside chair)?: A Little Help needed to walk in hospital room?: A Little Help needed climbing 3-5 steps with a railing? : A Little 6 Click Score: 18    End of Session Equipment Utilized During Treatment: Gait belt;Back brace Activity Tolerance: Patient limited by pain Patient left: in chair;with call bell/phone within reach;with family/visitor present Nurse Communication: Mobility status PT Visit Diagnosis: Unsteadiness on feet (R26.81);Pain     Time: 4098-1191 PT Time Calculation (min) (ACUTE ONLY): 44 min  Charges:  $Gait Training: 23-37 mins $Therapeutic Activity: 8-22 mins                     Larry Holloway, PT,  DPT Acute Rehabilitation Services Secure Chat Preferred Office: [phone]    Larry Holloway 08/15/2022, 12:06 PM

## 2022-08-16 NOTE — Progress Notes (Signed)
Neurosurgery Service Progress Note  Subjective: No acute events overnight. Back pain as expected, but is improving. States that he is feeling better today and is able to do more than yesterday in regards to function and movement.    Objective: Vitals:   08/15/22 1929 08/15/22 2308 08/16/22 0305 08/16/22 0800  BP: (!) 143/69 134/77 (!) 141/71 133/74  Pulse: 90 78 87   Resp: 20 20 19 17   Temp: 98.3 F (36.8 C) 98.6 F (37 C) 98.3 F (36.8 C) 98 F (36.7 C)  TempSrc: Oral Oral Oral Oral  SpO2: 94% 96% 99% 98%  Weight:      Height:        Physical Exam: Strength 5/5 x4 and SILTx4, incisions c/d/I   Assessment & Plan: 72 y.o. male s/p L1-pelvis PSF, recovering well.  -PT/OT -x-rays from yesterday show hardware intact and in place without loosening or complications -possible discharge home tomorrow   Emilee Hero, PA-C 08/16/22 10:48 AM

## 2022-08-16 NOTE — Progress Notes (Signed)
Physical Therapy Treatment Patient Details Name: Larry Holloway MRN: [id number] DOB: Apr 19, 1950 Today's Date: 08/16/2022   History of Present Illness 72 yo M adm for planned back sx. 5/13 anterolateral decompression at L1 2-3 and 3 4, and 5/16 posterior decompression and fusion L4 to the sacrum. PMH includes: Arthritis Back, Cancer of prostate, High blood pressure, and Pre-diabetes.    PT Comments    Pt demonstrating good improvements today in transfers, safety awareness, and precaution recall.  He ambulated 150'x2 with RW and performed 14 steps sideways with L rail to simulate home environment.  Pt tolerated well.  Continue plan of care.     Recommendations for follow up therapy are one component of a multi-disciplinary discharge planning process, led by the attending physician.  Recommendations may be updated based on patient status, additional functional criteria and insurance authorization.  Follow Up Recommendations       Assistance Recommended at Discharge Intermittent Supervision/Assistance  Patient can return home with the following A little help with walking and/or transfers;Assistance with cooking/housework;Assist for transportation;Help with stairs or ramp for entrance   Equipment Recommendations  Rolling walker (2 wheels);BSC/3in1    Recommendations for Other Services       Precautions / Restrictions Precautions Precautions: Fall;Back Precaution Booklet Issued: Yes (comment) Precaution Comments: Pt recalled and followed precautions Required Braces or Orthoses: Spinal Brace Spinal Brace: Lumbar corset;Applied in sitting position     Mobility  Bed Mobility Overal bed mobility: Needs Assistance Bed Mobility: Rolling, Sidelying to Sit, Sit to Sidelying Rolling: Supervision Sidelying to sit: Min assist     Sit to sidelying: Min assist General bed mobility comments: Log roll without cues.  Pt needs assist to get legs over edge of bed to and from sit     Transfers Overall transfer level: Needs assistance Equipment used: Rolling walker (2 wheels) Transfers: Sit to/from Stand Sit to Stand: Supervision           General transfer comment: Good hand placement; performed from bed and recliner without assist    Ambulation/Gait Ambulation/Gait assistance: Min guard, Supervision Gait Distance (Feet): 150 Feet (150'x2) Assistive device: Rolling walker (2 wheels) Gait Pattern/deviations: Step-through pattern Gait velocity: Decreased     General Gait Details: Good posture and RW proximity; ambulated 150'x2 with seated rest break, pt with audible crepitus L hip reports arthritis and  to have hip replaced -educated on step to gait L if painful but pt able to step through   Stairs Stairs: Yes Stairs assistance: Min guard Stair Management: One rail Left, Step to pattern, Forwards, Sideways Number of Stairs: 14 General stair comments: Started Forward with L rail and HHA and progressed to sideways with L rail.  Pt has L rail at home and prefers this method.   Wheelchair Mobility    Modified Rankin (Stroke Patients Only)       Balance Overall balance assessment: Needs assistance Sitting-balance support: Feet supported Sitting balance-Leahy Scale: Good     Standing balance support: Bilateral upper extremity supported, No upper extremity supported Standing balance-Leahy Scale: Fair Standing balance comment: RW to ambulate but could static stand without support                            Cognition Arousal/Alertness: Awake/alert Behavior During Therapy: WFL for tasks assessed/performed Overall Cognitive Status: Within Functional Limits for tasks assessed  General Comments: Pt wtih improved recall, able to say and follow all precautions, donned/doffed brace without cues        Exercises      General Comments        Pertinent Vitals/Pain Pain Assessment Pain  Assessment: 0-10 Pain Score: 4  Pain Location: back Pain Descriptors / Indicators: Aching, Operative site guarding, Sore Pain Intervention(s): Limited activity within patient's tolerance, Monitored during session, Repositioned    Home Living                          Prior Function            PT Goals (current goals can now be found in the care plan section) Progress towards PT goals: Progressing toward goals    Frequency    Min 5X/week      PT Plan Current plan remains appropriate    Co-evaluation              AM-PAC PT "6 Clicks" Mobility   Outcome Measure  Help needed turning from your back to your side while in a flat bed without using bedrails?: A Little Help needed moving from lying on your back to sitting on the side of a flat bed without using bedrails?: A Little Help needed moving to and from a bed to a chair (including a wheelchair)?: A Little Help needed standing up from a chair using your arms (e.g., wheelchair or bedside chair)?: A Little Help needed to walk in hospital room?: A Little Help needed climbing 3-5 steps with a railing? : A Little 6 Click Score: 18    End of Session Equipment Utilized During Treatment: Gait belt;Back brace Activity Tolerance: Patient tolerated treatment well Patient left: with call bell/phone within reach;in bed;with bed alarm set Nurse Communication: Mobility status PT Visit Diagnosis: Unsteadiness on feet (R26.81);Pain     Time: 1610-9604 PT Time Calculation (min) (ACUTE ONLY): 23 min  Charges:  $Gait Training: 8-22 mins $Therapeutic Activity: 8-22 mins                     Anise Salvo, PT Acute Rehab [facility] [phone]    Rayetta Humphrey 08/16/2022, 1:42 PM

## 2022-08-17 MED ORDER — OXYCODONE-ACETAMINOPHEN 5-325 MG PO TABS: 1.0000 | ORAL_TABLET | ORAL | 0 refills | Status: DC | PRN

## 2022-08-17 MED ORDER — METHOCARBAMOL 500 MG PO TABS: 500.0000 mg | ORAL_TABLET | Freq: Four times a day (QID) | ORAL | 3 refills | Status: DC | PRN

## 2022-08-17 NOTE — Discharge Summary (Signed)
Physician Discharge Summary  Patient ID: Larry Holloway MRN: [id number] DOB/AGE: 72-Apr-1952 72 y.o.  Admit date: 08/10/2022 Discharge date: 08/17/2022  Admission Diagnoses: Spondylolisthesis.  Lumbar spinal stenosis with neurogenic claudication, lumbar radiculopathy.  Degenerative scoliosis of the lumbar spine.  Discharge Diagnoses: Lumbar spinal stenosis.  Lumbar spondylolisthesis.  Neurogenic claudication.  Lumbar radiculopathy.  Degenerative scoliosis of the lumbar spine. Principal Problem:   Spondylolisthesis of lumbar region   Discharged Condition: good  Hospital Course: Patient was admitted to undergo surgical decompression and stabilization of the lumbar spine from L1-S1.  This was done in 2 stages with an anterolateral decompression at L1 2-3 and 3 4 and 4 5 and 1 stage and a posterior decompression at L5-S1 with fixation from L1 to the pelvis.  This was the second stage.  He tolerated surgery well.  He is ambulatory.  His incisions are clean and dry.  Consults: None  Significant Diagnostic Studies: None  Treatments: surgery: See op notes  Discharge Exam: Blood pressure 129/72, pulse 81, temperature 97.8 F (36.6 C), temperature source Oral, resp. rate 18, height 5\' 11"  (1.803 m), weight 83.9 kg, SpO2 96 %. Incisions are clean and dry Station and gait is intact.  Disposition: Discharge disposition: 01-Home or Self Care       Discharge Instructions     Call MD for:  redness, tenderness, or signs of infection (pain, swelling, redness, odor or green/yellow discharge around incision site)   Complete by: As directed    Call MD for:  severe uncontrolled pain   Complete by: As directed    Call MD for:  temperature >100.4   Complete by: As directed    Diet - low sodium heart healthy   Complete by: As directed    Incentive spirometry RT   Complete by: As directed    Increase activity slowly   Complete by: As directed       Allergies as of 08/17/2022        Reactions   Tape Swelling   Wound Dressing Adhesive Swelling        Medication List     TAKE these medications    ALPRAZolam 0.25 MG tablet Commonly known as: XANAX Take 0.25-0.5 mg by mouth 2 (two) times daily as needed (Restless leg).   amLODipine 5 MG tablet Commonly known as: NORVASC Take 7.5 mg by mouth daily.   cholecalciferol 25 MCG (1000 UNIT) tablet Commonly known as: VITAMIN D3 Take 1,000 Units by mouth daily.   DULoxetine 60 MG capsule Commonly known as: CYMBALTA Take 60 mg by mouth daily.   ferrous sulfate 325 (65 FE) MG tablet Take 325 mg by mouth daily with breakfast.   L-Methylfolate 15 MG Tabs Take 7.5 mg by mouth daily.   loratadine 10 MG tablet Commonly known as: CLARITIN Take 10 mg by mouth daily.   methocarbamol 500 MG tablet Commonly known as: ROBAXIN Take 1 tablet (500 mg total) by mouth every 6 (six) hours as needed for muscle spasms.   MULTIVITAMIN PO Take 1 tablet by mouth daily. One a day   omeprazole 20 MG capsule Commonly known as: PRILOSEC Take 20 mg by mouth daily.   oxyCODONE-acetaminophen 5-325 MG tablet Commonly known as: PERCOCET/ROXICET Take 1 tablet by mouth every 4 (four) hours as needed for moderate pain or severe pain.   pramipexole 0.5 MG tablet Commonly known as: MIRAPEX Take 0.5 mg by mouth 2 (two) times daily.   rosuvastatin 20 MG tablet Commonly known as: CRESTOR Take  20 mg by mouth at bedtime.   vitamin C 1000 MG tablet Take 1,000 mg by mouth daily.               Durable Medical Equipment  (From admission, onward)           Start     Ordered   08/16/22 1344  For home use only DME Walker rolling  Once       Question Answer Comment  Walker: With 5 Inch Wheels   Patient needs a walker to treat with the following condition Gait abnormality      08/16/22 1344             Signed: Stefani Dama 08/17/2022, 5:38 PM

## 2022-08-17 NOTE — Progress Notes (Signed)
Patient ID: Larry Holloway, male   DOB: 16-Oct-1950, 72 y.o.   MRN: [id number] Vital signs are stable Patient's incisions are clean and dry He is ambulatory Will plan on discharging him home today He is tolerating Percocet and Robaxin he has some left hip pain related to the degenerated hip there

## 2022-08-17 NOTE — Care Management Important Message (Signed)
Important Message  Patient Details  Name: Larry Holloway MRN: [id number] Date of Birth: [birth year]   Medicare Important Message Given:  Yes     Sherilyn Banker 08/17/2022, 2:06 PM

## 2022-08-17 NOTE — Progress Notes (Signed)
Physical Therapy Treatment Patient Details Name: Larry Holloway MRN: [id number] DOB: [birth year] Today's Date: 08/17/2022   History of Present Illness 72 yo M adm for planned back sx. 5/13 anterolateral decompression at L1 2-3 and 3 4, and 5/16 posterior decompression and fusion L4 to the sacrum. PMH includes: Arthritis Back, Cancer of prostate, High blood pressure, and Pre-diabetes.    PT Comments    Pt tolerating mobility well, complaining of minimal pain only with walking. Pt ambulated good hallway distance, requires min cues for form and safety throughout. Pt eager to d/c home with support of wife. Pt with no further questions for PT.      Recommendations for follow up therapy are one component of a multi-disciplinary discharge planning process, led by the attending physician.  Recommendations may be updated based on patient status, additional functional criteria and insurance authorization.  Follow Up Recommendations       Assistance Recommended at Discharge Intermittent Supervision/Assistance  Patient can return home with the following A little help with walking and/or transfers;Assistance with cooking/housework;Assist for transportation;Help with stairs or ramp for entrance   Equipment Recommendations  Rolling walker (2 wheels);BSC/3in1    Recommendations for Other Services       Precautions / Restrictions Precautions Precautions: Fall;Back Precaution Booklet Issued: Yes (comment) Precaution Comments: Pt recalled and followed precautions Required Braces or Orthoses: Spinal Brace Spinal Brace: Lumbar corset;Applied in sitting position Restrictions Weight Bearing Restrictions: No     Mobility  Bed Mobility Overal bed mobility: Needs Assistance Bed Mobility: Rolling, Sidelying to Sit, Sit to Sidelying Rolling: Supervision Sidelying to sit: Min assist     Sit to sidelying: Min guard General bed mobility comments: light trunk and LE assist, states wife is able to  help him at home    Transfers Overall transfer level: Needs assistance Equipment used: Rolling walker (2 wheels) Transfers: Sit to/from Stand Sit to Stand: Supervision           General transfer comment: for safety, slow to rise and sit but no physical assist    Ambulation/Gait Ambulation/Gait assistance: Supervision Gait Distance (Feet): 175 Feet Assistive device: Rolling walker (2 wheels) Gait Pattern/deviations: Step-through pattern, Decreased stride length, Trunk flexed Gait velocity: decr     General Gait Details: cues for upright posture, placement in RW. good recall of leading with LLE as needed given L hip dysfunction   Stairs             Wheelchair Mobility    Modified Rankin (Stroke Patients Only)       Balance Overall balance assessment: Needs assistance Sitting-balance support: Feet supported Sitting balance-Leahy Scale: Good     Standing balance support: Bilateral upper extremity supported, No upper extremity supported Standing balance-Leahy Scale: Fair Standing balance comment: RW to ambulate but could static stand without support                            Cognition Arousal/Alertness: Awake/alert Behavior During Therapy: WFL for tasks assessed/performed Overall Cognitive Status: Within Functional Limits for tasks assessed Area of Impairment: Safety/judgement, Memory                         Safety/Judgement: Decreased awareness of safety     General Comments: min cues for donning brace, recalls BLT rules        Exercises  Home walking program: up and walking 1x/hour during waking hours for short household distances  with supervision of family, to promote circulation, activity tolerance, and strength maintenance.      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 PT Goal Formulation: With patient Time For Goal Achievement: 08/18/22 Potential to Achieve Goals: Good Progress towards PT goals: Progressing toward goals    Frequency    Min 5X/week      PT Plan Current plan remains appropriate    Co-evaluation              AM-PAC PT "6 Clicks" Mobility   Outcome Measure  Help needed turning from your back to your side while in a flat bed without using bedrails?: A Little Help needed moving from lying on your back to sitting on the side of a flat bed without using bedrails?: A Little Help needed moving to and from a bed to a chair (including a wheelchair)?: A Little Help needed standing up from a chair using your arms (e.g., wheelchair or bedside chair)?: A Little Help needed to walk in hospital room?: A Little Help needed climbing 3-5 steps with a railing? : A Little 6 Click Score: 18    End of Session Equipment Utilized During Treatment: Back brace Activity Tolerance: Patient tolerated treatment well Patient left: with call bell/phone within reach;in bed;with bed alarm set Nurse Communication: Mobility status PT Visit Diagnosis: Unsteadiness on feet (R26.81);Pain     Time: 1610-9604 PT Time Calculation (min) (ACUTE ONLY): 21 min  Charges:  $Gait Training: 8-22 mins                     Marye Round, PT DPT Acute Rehabilitation Services Secure Chat Preferred  Office [phone]    Gelena Klosinski Sheliah Plane 08/17/2022, 12:10 PM

## 2022-08-17 NOTE — Progress Notes (Signed)
Occupational Therapy Treatment Patient Details Name: Larry Holloway MRN: [id number] DOB: [birth year] Today's Date: 08/17/2022   History of present illness 72 yo M adm for planned back sx. 5/13 anterolateral decompression at L1 2-3 and 3 4, and 5/16 posterior decompression and fusion L4 to the sacrum. PMH includes: Arthritis Back, Cancer of prostate, High blood pressure, and Pre-diabetes.   OT comments  Pt progressing towards goals this session, able to complete standing grooming task with set up A, min A for LB ADLs, continued education on use of AE including sock aid and reacher. Pt min guard A for bed mobility using log roll technique and for transfers with RW. Pt able to verbalize and adhere to back precautions throughout session. Pt presenting with impairments listed below, will follow acutely. Continue to recommend HHOT at d/c.    Recommendations for follow up therapy are one component of a multi-disciplinary discharge planning process, led by the attending physician.  Recommendations may be updated based on patient status, additional functional criteria and insurance authorization.    Assistance Recommended at Discharge Intermittent Supervision/Assistance  Patient can return home with the following  Assist for transportation   Equipment Recommendations  BSC/3in1;Other (comment) (RW)    Recommendations for Other Services      Precautions / Restrictions Precautions Precautions: Fall;Back Precaution Booklet Issued: Yes (comment) Precaution Comments: Pt recalled and followed precautions Required Braces or Orthoses: Spinal Brace Spinal Brace: Lumbar corset;Applied in sitting position Restrictions Weight Bearing Restrictions: No       Mobility Bed Mobility Overal bed mobility: Needs Assistance Bed Mobility: Rolling, Sidelying to Sit, Sit to Sidelying Rolling: Supervision Sidelying to sit: Min guard     Sit to sidelying: Min guard General bed mobility comments: with use of  log roll technique    Transfers Overall transfer level: Needs assistance Equipment used: Rolling walker (2 wheels) Transfers: Sit to/from Stand Sit to Stand: Min guard                 Balance Overall balance assessment: Needs assistance Sitting-balance support: Feet supported Sitting balance-Leahy Scale: Good     Standing balance support: Bilateral upper extremity supported, No upper extremity supported Standing balance-Leahy Scale: Fair Standing balance comment: RW to ambulate but could static stand without support                           ADL either performed or assessed with clinical judgement   ADL       Grooming: Oral care;Set up;Standing Grooming Details (indicate cue type and reason): standing at sink         Upper Body Dressing : Minimal assistance Upper Body Dressing Details (indicate cue type and reason): donning back brace Lower Body Dressing: Minimal assistance;Adhering to back precautions;With adaptive equipment;Sitting/lateral leans Lower Body Dressing Details (indicate cue type and reason): with use of sock aid/reacher Toilet Transfer: Min guard;Ambulation;Rolling walker (2 wheels) Toilet Transfer Details (indicate cue type and reason): simulated via functional mobility         Functional mobility during ADLs: Min guard;Rolling walker (2 wheels);Cueing for safety      Extremity/Trunk Assessment Upper Extremity Assessment Upper Extremity Assessment: Generalized weakness   Lower Extremity Assessment Lower Extremity Assessment: Defer to PT evaluation        Vision   Vision Assessment?: No apparent visual deficits   Perception Perception Perception: Not tested   Praxis Praxis Praxis: Not tested    Cognition Arousal/Alertness: Awake/alert Behavior During  Therapy: WFL for tasks assessed/performed Overall Cognitive Status: Within Functional Limits for tasks assessed Area of Impairment: Safety/judgement, Memory                                General Comments: Pt wtih improved recall, able to say and follow all precautions, donned/doffed brace without cues        Exercises      Shoulder Instructions       General Comments family present throughout session, VSS    Pertinent Vitals/ Pain       Pain Assessment Pain Assessment: Faces Pain Score: 4  Faces Pain Scale: Hurts little more Pain Location: back Pain Descriptors / Indicators: Aching, Operative site guarding, Sore Pain Intervention(s): Limited activity within patient's tolerance, Monitored during session, Repositioned  Home Living                                          Prior Functioning/Environment              Frequency  Min 1X/week        Progress Toward Goals  OT Goals(current goals can now be found in the care plan section)  Progress towards OT goals: Progressing toward goals  Acute Rehab OT Goals Patient Stated Goal: none stated OT Goal Formulation: With patient/family Time For Goal Achievement: 08/25/22 Potential to Achieve Goals: Good ADL Goals Pt Will Perform Grooming: with modified independence;standing Pt Will Perform Lower Body Dressing: with modified independence;sit to/from stand Pt Will Transfer to Toilet: Independently;ambulating;regular height toilet  Plan Discharge plan remains appropriate;Frequency remains appropriate    Co-evaluation                 AM-PAC OT "6 Clicks" Daily Activity     Outcome Measure   Help from another person eating meals?: None Help from another person taking care of personal grooming?: None Help from another person toileting, which includes using toliet, bedpan, or urinal?: A Little Help from another person bathing (including washing, rinsing, drying)?: A Little Help from another person to put on and taking off regular upper body clothing?: None Help from another person to put on and taking off regular lower body clothing?: A Little 6  Click Score: 21    End of Session Equipment Utilized During Treatment: Rolling walker (2 wheels);Back brace  OT Visit Diagnosis: Unsteadiness on feet (R26.81)   Activity Tolerance Patient tolerated treatment well   Patient Left in bed;with call bell/phone within reach;with bed alarm set;with family/visitor present   Nurse Communication Mobility status        Time: 4098-1191 OT Time Calculation (min): 30 min  Charges: OT General Charges $OT Visit: 1 Visit OT Treatments $Self Care/Home Management : 23-37 mins  Carver Fila, OTD, OTR/L SecureChat Preferred Acute Rehab [phone]   Carver Fila Koonce 08/17/2022, 11:49 AM

## 2022-08-17 NOTE — TOC Initial Note (Signed)
Transition of Care (TOC) - Initial/Assessment Note  Donn Pierini RN,BSN Transitions of Care Unit 4NP (Non Trauma)- RN Case Manager See Treatment Team for direct Phone #  Patient Details  Name: Larry Holloway MRN: [id number] Date of Birth: 04/13/1950  Transition of Care [facility]) CM/SW Contact:    Darrold Span, RN Phone Number: 08/17/2022, 4:38 PM  Clinical Narrative:                 Cm spoke with pt at bedside for transition of care needs. From home w/ spouse- who will provide transportation home.   Discussed HH and DME needs.  Per pt he would like a RW for home however does not feel he will need BSC at this time as his bathroom is close and his toilet is high.   Per pt he does not have a preference for DME provider and is ok with one that delivers to hospital. List provided for [facility] choice Per CMS guidelines from [email or web address] website with star ratings (copy placed in shadow chart)- pt voiced he would like to try [facility] as first choice and if they are unable to service then he is ok with anyone else that can provide needed PT/OT at home.   Call made to [employer] for DME-RW - RW to be delivered to room prior to discharge.   Pt will need HHPT/OT orders CM will f/u on [facility] referral- pending orders   Expected Discharge Plan: Home w Home Health Services Barriers to Discharge: No Barriers Identified   Patient Goals and CMS Choice Patient states their goals for this hospitalization and ongoing recovery are:: return home CMS Medicare.gov Compare Post Acute Care list provided to:: Patient Choice offered to / list presented to : Patient      Expected Discharge Plan and Services   Discharge Planning Services: CM Consult Post Acute Care Choice: Durable Medical Equipment, Home Health Living arrangements for the past 2 months: Single Family Home                 DME Arranged: Walker rolling DME Agency: [facility] Date DME Agency Contacted: 08/17/22 Time DME Agency  Contacted: 1415 Representative spoke with at DME Agency: Zollie Beckers HH Arranged: PT, OT HH Agency: [facility] Date [facility] Agency Contacted: 08/17/22 Time HH Agency Contacted: 1455 Representative spoke with at [facility] Agency: Christian  Prior Living Arrangements/Services Living arrangements for the past 2 months: Single Family Home Lives with:: Spouse Patient language and need for interpreter reviewed:: Yes Do you feel safe going back to the place where you live?: Yes      Need for Family Participation in Patient Care: Yes (Comment) Care giver support system in place?: Yes (comment) Current home services: DME Criminal Activity/Legal Involvement Pertinent to Current Situation/Hospitalization: No - Comment as needed  Activities of Daily Living Home Assistive Devices/Equipment: Hearing aid, Eyeglasses ADL Screening (condition at time of admission) Is the patient deaf or have difficulty hearing?: No Does the patient have difficulty seeing, even when wearing glasses/contacts?: Yes Does the patient have difficulty concentrating, remembering, or making decisions?: No Does the patient have difficulty dressing or bathing?: Yes Does the patient have difficulty walking or climbing stairs?: Yes  Permission Sought/Granted Permission sought to share information with : Facility Industrial/product designer granted to share information with : Yes, Verbal Permission Granted     Permission granted to share info w AGENCY: HH/DME        Emotional Assessment Appearance:: Appears stated age Attitude/Demeanor/Rapport: Engaged Affect (typically  observed): Accepting, Appropriate, Pleasant Orientation: : Oriented to Self, Oriented to Place, Oriented to  Time, Oriented to Situation Alcohol / Substance Use: Not Applicable Psych Involvement: No (comment)  Admission diagnosis:  Spondylolisthesis of lumbar region [M43.16] Patient Active Problem List   Diagnosis Date Noted   Spondylolisthesis of lumbar  region 08/10/2022   Cervical disc disorder with radiculopathy of cervical region 09/12/2012   Right arm weakness 08/01/2012   Cancer of prostate (HCC)    Mild depression    Back pain    Neck pain    Right arm pain    PCP:  Daisy Floro, MD Pharmacy:   [facility] [address], [place] - [address] [place] [address] Phone: [phone] Fax: [phone]     Social Determinants of Health (SDOH) Social History: SDOH Screenings   Food Insecurity: No Food Insecurity (08/14/2022)  Housing: Patient Declined (08/14/2022)  Transportation Needs: No Transportation Needs (08/14/2022)  Utilities: Not At Risk (08/14/2022)  Tobacco Use: Medium Risk (08/13/2022)   SDOH Interventions:     Readmission Risk Interventions    08/17/2022    3:27 PM  Readmission Risk Prevention Plan  Medication Screening Complete  Transportation Screening Complete

## 2022-08-19 MED FILL — Sodium Chloride IV Soln 0.9%: INTRAVENOUS | Qty: 1000 | Status: AC

## 2022-08-19 MED FILL — Heparin Sodium (Porcine) Inj 1000 Unit/ML: INTRAMUSCULAR | Qty: 30 | Status: AC

## 2022-09-02 DIAGNOSIS — M1612 Unilateral primary osteoarthritis, left hip: Secondary | ICD-10-CM | POA: Diagnosis not present

## 2022-09-02 DIAGNOSIS — M4316 Spondylolisthesis, lumbar region: Secondary | ICD-10-CM | POA: Diagnosis not present

## 2022-09-10 DIAGNOSIS — G479 Sleep disorder, unspecified: Secondary | ICD-10-CM | POA: Diagnosis not present

## 2022-09-10 DIAGNOSIS — F324 Major depressive disorder, single episode, in partial remission: Secondary | ICD-10-CM | POA: Diagnosis not present

## 2022-09-10 DIAGNOSIS — G2581 Restless legs syndrome: Secondary | ICD-10-CM | POA: Diagnosis not present

## 2022-09-10 DIAGNOSIS — I10 Essential (primary) hypertension: Secondary | ICD-10-CM | POA: Diagnosis not present

## 2022-09-25 DIAGNOSIS — M4316 Spondylolisthesis, lumbar region: Secondary | ICD-10-CM | POA: Diagnosis not present

## 2022-09-30 DIAGNOSIS — M1612 Unilateral primary osteoarthritis, left hip: Secondary | ICD-10-CM | POA: Diagnosis not present

## 2022-09-30 DIAGNOSIS — M25552 Pain in left hip: Secondary | ICD-10-CM | POA: Diagnosis not present

## 2022-10-05 DIAGNOSIS — R262 Difficulty in walking, not elsewhere classified: Secondary | ICD-10-CM | POA: Diagnosis not present

## 2022-10-05 DIAGNOSIS — M25652 Stiffness of left hip, not elsewhere classified: Secondary | ICD-10-CM | POA: Diagnosis not present

## 2022-10-05 DIAGNOSIS — M1612 Unilateral primary osteoarthritis, left hip: Secondary | ICD-10-CM | POA: Diagnosis not present

## 2022-10-15 DIAGNOSIS — Z96642 Presence of left artificial hip joint: Secondary | ICD-10-CM | POA: Diagnosis not present

## 2022-10-15 DIAGNOSIS — M1612 Unilateral primary osteoarthritis, left hip: Secondary | ICD-10-CM | POA: Diagnosis not present

## 2022-10-19 DIAGNOSIS — M6281 Muscle weakness (generalized): Secondary | ICD-10-CM | POA: Diagnosis not present

## 2022-10-19 DIAGNOSIS — Z96642 Presence of left artificial hip joint: Secondary | ICD-10-CM | POA: Diagnosis not present

## 2022-10-19 DIAGNOSIS — M25652 Stiffness of left hip, not elsewhere classified: Secondary | ICD-10-CM | POA: Diagnosis not present

## 2022-10-23 DIAGNOSIS — Z96642 Presence of left artificial hip joint: Secondary | ICD-10-CM | POA: Diagnosis not present

## 2022-10-23 DIAGNOSIS — M6281 Muscle weakness (generalized): Secondary | ICD-10-CM | POA: Diagnosis not present

## 2022-10-23 DIAGNOSIS — M25652 Stiffness of left hip, not elsewhere classified: Secondary | ICD-10-CM | POA: Diagnosis not present

## 2022-10-26 DIAGNOSIS — M25652 Stiffness of left hip, not elsewhere classified: Secondary | ICD-10-CM | POA: Diagnosis not present

## 2022-10-28 DIAGNOSIS — M25652 Stiffness of left hip, not elsewhere classified: Secondary | ICD-10-CM | POA: Diagnosis not present

## 2022-10-28 DIAGNOSIS — M6281 Muscle weakness (generalized): Secondary | ICD-10-CM | POA: Diagnosis not present

## 2022-10-28 DIAGNOSIS — Z96642 Presence of left artificial hip joint: Secondary | ICD-10-CM | POA: Diagnosis not present

## 2022-10-30 DIAGNOSIS — M6281 Muscle weakness (generalized): Secondary | ICD-10-CM | POA: Diagnosis not present

## 2022-10-30 DIAGNOSIS — Z96642 Presence of left artificial hip joint: Secondary | ICD-10-CM | POA: Diagnosis not present

## 2022-10-30 DIAGNOSIS — M25652 Stiffness of left hip, not elsewhere classified: Secondary | ICD-10-CM | POA: Diagnosis not present

## 2022-11-02 DIAGNOSIS — M25652 Stiffness of left hip, not elsewhere classified: Secondary | ICD-10-CM | POA: Diagnosis not present

## 2022-11-02 DIAGNOSIS — Z96642 Presence of left artificial hip joint: Secondary | ICD-10-CM | POA: Diagnosis not present

## 2022-11-02 DIAGNOSIS — M6281 Muscle weakness (generalized): Secondary | ICD-10-CM | POA: Diagnosis not present

## 2022-11-04 DIAGNOSIS — M6281 Muscle weakness (generalized): Secondary | ICD-10-CM | POA: Diagnosis not present

## 2022-11-04 DIAGNOSIS — Z96642 Presence of left artificial hip joint: Secondary | ICD-10-CM | POA: Diagnosis not present

## 2022-11-04 DIAGNOSIS — M25652 Stiffness of left hip, not elsewhere classified: Secondary | ICD-10-CM | POA: Diagnosis not present

## 2022-11-10 DIAGNOSIS — U071 COVID-19: Secondary | ICD-10-CM | POA: Diagnosis not present

## 2022-11-17 DIAGNOSIS — Z96642 Presence of left artificial hip joint: Secondary | ICD-10-CM | POA: Diagnosis not present

## 2022-11-17 DIAGNOSIS — M25652 Stiffness of left hip, not elsewhere classified: Secondary | ICD-10-CM | POA: Diagnosis not present

## 2022-11-17 DIAGNOSIS — M6281 Muscle weakness (generalized): Secondary | ICD-10-CM | POA: Diagnosis not present

## 2022-11-25 DIAGNOSIS — M4316 Spondylolisthesis, lumbar region: Secondary | ICD-10-CM | POA: Diagnosis not present

## 2022-12-04 DIAGNOSIS — I10 Essential (primary) hypertension: Secondary | ICD-10-CM | POA: Diagnosis not present

## 2022-12-04 DIAGNOSIS — E7800 Pure hypercholesterolemia, unspecified: Secondary | ICD-10-CM | POA: Diagnosis not present

## 2022-12-04 DIAGNOSIS — Z8546 Personal history of malignant neoplasm of prostate: Secondary | ICD-10-CM | POA: Diagnosis not present

## 2022-12-08 DIAGNOSIS — R001 Bradycardia, unspecified: Secondary | ICD-10-CM | POA: Diagnosis not present

## 2022-12-08 DIAGNOSIS — E7800 Pure hypercholesterolemia, unspecified: Secondary | ICD-10-CM | POA: Diagnosis not present

## 2022-12-08 DIAGNOSIS — Z0000 Encounter for general adult medical examination without abnormal findings: Secondary | ICD-10-CM | POA: Diagnosis not present

## 2022-12-08 DIAGNOSIS — F324 Major depressive disorder, single episode, in partial remission: Secondary | ICD-10-CM | POA: Diagnosis not present

## 2022-12-08 DIAGNOSIS — R7303 Prediabetes: Secondary | ICD-10-CM | POA: Diagnosis not present

## 2022-12-09 DIAGNOSIS — Z0000 Encounter for general adult medical examination without abnormal findings: Secondary | ICD-10-CM | POA: Diagnosis not present

## 2022-12-09 DIAGNOSIS — H6123 Impacted cerumen, bilateral: Secondary | ICD-10-CM | POA: Diagnosis not present

## 2022-12-31 ENCOUNTER — Ambulatory Visit (INDEPENDENT_AMBULATORY_CARE_PROVIDER_SITE_OTHER): Payer: Medicare Other

## 2022-12-31 ENCOUNTER — Encounter: Payer: Self-pay | Admitting: Cardiology

## 2022-12-31 ENCOUNTER — Ambulatory Visit: Payer: Medicare Other | Attending: Cardiology | Admitting: Cardiology

## 2022-12-31 VITALS — BP 142/60 | HR 85 | Ht 70.0 in | Wt 182.0 lb

## 2022-12-31 DIAGNOSIS — R0609 Other forms of dyspnea: Secondary | ICD-10-CM | POA: Diagnosis not present

## 2022-12-31 DIAGNOSIS — R001 Bradycardia, unspecified: Secondary | ICD-10-CM

## 2022-12-31 NOTE — Patient Instructions (Signed)
Medication Instructions:  Your physician recommends that you continue on your current medications as directed. Please refer to the Current Medication list given to you today.  *If you need a refill on your cardiac medications before your next appointment, please call your pharmacy*   Lab Work: Please complete a Pro- BNP in our lab today before you leave.   If you have labs (blood work) drawn today and your tests are completely normal, you will receive your results only by: MyChart Message (if you have MyChart) OR A paper copy in the mail If you have any lab test that is abnormal or we need to change your treatment, we will call you to review the results.   Testing/Procedures: Your physician has requested that you have an echocardiogram. Echocardiography is a painless test that uses sound waves to create images of your heart. It provides your doctor with information about the size and shape of your heart and how well your heart's chambers and valves are working. This procedure takes approximately one hour. There are no restrictions for this procedure. Please do NOT wear cologne, perfume, aftershave, or lotions (deodorant is allowed). Please arrive 15 minutes prior to your appointment time.  Your physician has ordered a coronary calcium score CT. This is a test that can detect coronary artery disease at an early stage. Patient cost is $94-99.   ZIO XT- Long Term Monitor Instructions  Your physician has requested you wear a ZIO patch monitor for 14 days.  This is a single patch monitor. [employer] supplies one patch monitor per enrollment. Additional stickers are not available. Please do not apply patch if you will be having a Nuclear Stress Test,  Echocardiogram, Cardiac CT, MRI, or Chest Xray during the period you would be wearing the  monitor. The patch cannot be worn during these tests. You cannot remove and re-apply the  ZIO XT patch monitor.  Your ZIO patch monitor will be mailed 3 day  USPS to your address on file. It may take 3-5 days  to receive your monitor after you have been enrolled.  Once you have received your monitor, please review the enclosed instructions. Your monitor  has already been registered assigning a specific monitor serial # to you.  Billing and Patient Assistance Program Information  We have supplied [employer] with any of your insurance information on file for billing purposes. [employer] offers a sliding scale Patient Assistance Program for patients that do not have  insurance, or whose insurance does not completely cover the cost of the ZIO monitor.  You must apply for the Patient Assistance Program to qualify for this discounted rate.  To apply, please call [employer] at [phone], select option 4, select option 2, ask to apply for  Patient Assistance Program. Meredeth Ide will ask your household income, and how many people  are in your household. They will quote your out-of-pocket cost based on that information.  [employer] will also be able to set up a 78-month, interest-free payment plan if needed.  Applying the monitor   Shave hair from upper left chest.  Hold abrader disc by orange tab. Rub abrader in 40 strokes over the upper left chest as  indicated in your monitor instructions.  Clean area with 4 enclosed alcohol pads. Let dry.  Apply patch as indicated in monitor instructions. Patch will be placed under collarbone on left  side of chest with arrow pointing upward.  Rub patch adhesive wings for 2 minutes. Remove white label marked "1". Remove the white  label marked "2". Rub patch adhesive wings for 2 additional minutes.  While looking in a mirror, press and release button in center of patch. A small green light will  flash 3-4 times. This will be your only indicator that the monitor has been turned on.  Do not shower for the first 24 hours. You may shower after the first 24 hours.  Press the button if you feel a symptom. You will hear a small  click. Record Date, Time and  Symptom in the Patient Logbook.  When you are ready to remove the patch, follow instructions on the last 2 pages of Patient  Logbook. Stick patch monitor onto the last page of Patient Logbook.  Place Patient Logbook in the blue and white box. Use locking tab on box and tape box closed  securely. The blue and white box has prepaid postage on it. Please place it in the mailbox as  soon as possible. Your physician should have your test results approximately 7 days after the  monitor has been mailed back to [facility].  Call [facility] Customer Care at [phone] if you have questions regarding  your ZIO XT patch monitor. Call them immediately if you see an orange light blinking on your  monitor.  If your monitor falls off in less than 4 days, contact our Monitor department at [phone].  If your monitor becomes loose or falls off after 4 days call [employer] at [phone] for  suggestions on securing your monitor    Follow-Up: At [facility], you and your health needs are our priority.  As part of our continuing mission to provide you with exceptional heart care, we have created designated Provider Care Teams.  These Care Teams include your primary Cardiologist (physician) and Advanced Practice Providers (APPs -  Physician Assistants and Nurse Practitioners) who all work together to provide you with the care you need, when you need it.  We recommend signing up for the patient portal called "MyChart".  Sign up information is provided on this After Visit Summary.  MyChart is used to connect with patients for Virtual Visits (Telemedicine).  Patients are able to view lab/test results, encounter notes, upcoming appointments, etc.  Non-urgent messages can be sent to your provider as well.   To learn more about what you can do with MyChart, go to [email or web address].    Your next appointment will be dependent on the results of your testing:      Provider:   Dr. Truett Mainland

## 2022-12-31 NOTE — Progress Notes (Signed)
  Cardiology Office Note:  .   Date:  12/31/2022  ID:  Larry Holloway, DOB 06/07/1950, MRN [id number] PCP: Daisy Floro, MD  [facility] Providers Cardiologist:  Truett Mainland, MD PCP: Daisy Floro, MD  Chief Complaint  Patient presents with   Bradycardia      History of Present Illness: .    Larry Holloway is a 72 y.o. male with hypertension, hyperlipidemia, prediabetes, peripheral neuropathy, referred for evaluation of bradycardia and concern for CAD  Patient is a Insurance account manager, is currently studying divinity.  He has had episodes of bradycardia as low as 38 bpm, without necessarily any associated symptoms.  Separately, he has had episodes of lightheadedness, but denies syncope.  He has mild exertional dyspnea, but denies chest pain.  He is concerned about risk for CAD.  He mentions that he is wife had a calcium score scan checked and he would like to be seen.  Separately, he reports numbness on top of his feet with occasional swelling.  Vitals:   12/31/22 1551  BP: (!) 142/60  Pulse: 85  SpO2: 98%     ROS:  Review of Systems  Cardiovascular:  Negative for chest pain, dyspnea on exertion, leg swelling, palpitations and syncope.  Neurological:  Positive for light-headedness.     Studies Reviewed: .       12/04/2022: Glucose 101, BUN/Cr 13/0.84. EGFR 93. Na/K 137/4.4. Rest of the CMP normal Chol 165, TG 77, HDL 56, LDL 95   Physical Exam:   Physical Exam Vitals and nursing note reviewed.  Constitutional:      General: He is not in acute distress. Neck:     Vascular: No JVD.  Cardiovascular:     Rate and Rhythm: Normal rate and regular rhythm.     Heart sounds: Normal heart sounds. No murmur heard. Pulmonary:     Effort: Pulmonary effort is normal.     Breath sounds: Normal breath sounds. No wheezing or rales.  Musculoskeletal:     Right lower leg: No edema.     Left lower leg: No edema.      VISIT DIAGNOSES:   ICD-10-CM   1.  Bradycardia  R00.1 LONG TERM MONITOR (3-14 DAYS)    2. Dyspnea on exertion  R06.09 CT CARDIAC SCORING    ECHOCARDIOGRAM COMPLETE    Pro b natriuretic peptide (BNP)       ASSESSMENT AND PLAN: .    Larry Holloway is a 72 y.o. male with hypertension, hyperlipidemia, prediabetes, peripheral neuropathy, referred for evaluation of bradycardia and concern for CAD  Lightheadedness: Less likely to be cardiogenic in etiology.  That said, he has had episodes of bradycardia in high 30s.  Recommend 2-week Zio patch monitor.  Dyspnea on exertion: Physical exam is unremarkable.  I will check echocardiogram and BNP.  On a separate note, I will also check calcium score scan for CAD screening, as requested by patient.  Follow-up as needed, unless any significant abnormalities found on the above testing.   Signed, Elder Negus, MD

## 2022-12-31 NOTE — Progress Notes (Unsigned)
Enrolled for [employer] to mail a ZIO XT long term holter monitor to the patients address on file.  

## 2023-01-01 ENCOUNTER — Ambulatory Visit: Payer: Medicare Other | Attending: Cardiology

## 2023-01-01 DIAGNOSIS — R0609 Other forms of dyspnea: Secondary | ICD-10-CM | POA: Diagnosis not present

## 2023-01-02 LAB — PRO B NATRIURETIC PEPTIDE: NT-Pro BNP: 103 pg/mL (ref 0–376)

## 2023-01-05 DIAGNOSIS — R001 Bradycardia, unspecified: Secondary | ICD-10-CM | POA: Diagnosis not present

## 2023-01-15 DIAGNOSIS — H34812 Central retinal vein occlusion, left eye, with macular edema: Secondary | ICD-10-CM | POA: Diagnosis not present

## 2023-01-21 ENCOUNTER — Ambulatory Visit (HOSPITAL_COMMUNITY): Payer: Medicare Other | Attending: Cardiology

## 2023-01-21 DIAGNOSIS — R0609 Other forms of dyspnea: Secondary | ICD-10-CM

## 2023-01-21 LAB — ECHOCARDIOGRAM COMPLETE
Area-P 1/2: 3.39 cm2
S' Lateral: 2.7 cm

## 2023-01-22 DIAGNOSIS — M25652 Stiffness of left hip, not elsewhere classified: Secondary | ICD-10-CM | POA: Diagnosis not present

## 2023-01-22 NOTE — Progress Notes (Signed)
Aorta is mildly dilated. Aorta size 55 mm or above may require surgery.  POS is 38-41 mm.  I do not expect that she will need surgery anytime soon, or probably neighbor.  This is no cause for immediate concern, but needs annual echocardiogram monitoring.  It is important to keep blood pressure and heart rate under control and avoid heavy lifting.  Aerobic activity is no problem.  I will await monitor results to further recommend whether you should be on a beta-blocker or not.  Thanks MJP

## 2023-01-25 DIAGNOSIS — H353112 Nonexudative age-related macular degeneration, right eye, intermediate dry stage: Secondary | ICD-10-CM | POA: Diagnosis not present

## 2023-01-25 DIAGNOSIS — H34812 Central retinal vein occlusion, left eye, with macular edema: Secondary | ICD-10-CM | POA: Diagnosis not present

## 2023-01-25 DIAGNOSIS — H35711 Central serous chorioretinopathy, right eye: Secondary | ICD-10-CM | POA: Diagnosis not present

## 2023-01-27 DIAGNOSIS — R001 Bradycardia, unspecified: Secondary | ICD-10-CM | POA: Diagnosis not present

## 2023-01-28 DIAGNOSIS — H353112 Nonexudative age-related macular degeneration, right eye, intermediate dry stage: Secondary | ICD-10-CM | POA: Diagnosis not present

## 2023-01-28 DIAGNOSIS — H34812 Central retinal vein occlusion, left eye, with macular edema: Secondary | ICD-10-CM | POA: Diagnosis not present

## 2023-01-29 ENCOUNTER — Telehealth: Payer: Self-pay | Admitting: *Deleted

## 2023-01-29 ENCOUNTER — Ambulatory Visit (HOSPITAL_COMMUNITY)
Admission: RE | Admit: 2023-01-29 | Discharge: 2023-01-29 | Disposition: A | Discharge status: Home / Self Care | Payer: Medicare Other | Source: Ambulatory Visit | Attending: Cardiology | Admitting: Cardiology

## 2023-01-29 DIAGNOSIS — R0609 Other forms of dyspnea: Secondary | ICD-10-CM

## 2023-01-29 DIAGNOSIS — I2510 Atherosclerotic heart disease of native coronary artery without angina pectoris: Secondary | ICD-10-CM

## 2023-01-29 NOTE — Telephone Encounter (Addendum)
-----   Message from [facility] sent at 01/29/2023  4:54 PM EDT ----- Please see separate result about heart monitor.  While symptoms of rapid heartbeat can be managed with medications as noted in separate Zio patch result note, one episode of ventricular tachycardia is more significant now that I know that he has high calcium score.  Recommend cardiac PET/CT stress test given high calcium score, including in left main.  Larry Emiliano Dyer, MD  Patwardhan, Anabel Bene, MD  P Cv Div Ch St Triage Brief episodes of rapid heartbeat both from top and bottom chamber of the heart.  Perceived bradycardia could be due to extra weight not counted in peripheral pulse.  Heart is structurally normal on echocardiogram.  If symptoms of palpitations are limited, this can be monitored.  If symptoms are frequent, could consider metoprolol 12.5 mg twice daily.  Thanks MJP  ___________________________________________________________  Larry Holloway patient and reviewed.  He has concerns and may have further questions after he reviews the tests in the portal.   I will send the NM PET instructions there.  He also wants to make sure the doctor knows he has been diagnosed with a retinal occlusion and got a shot in his eye yesterday and was told it was due to a blockage of some type.

## 2023-01-29 NOTE — Progress Notes (Signed)
Brief episodes of rapid heartbeat both from top and bottom chamber of the heart.  Perceived bradycardia could be due to extra weight not counted in peripheral pulse.  Heart is structurally normal on echocardiogram.  If symptoms of palpitations are limited, this can be monitored.  If symptoms are frequent, could consider metoprolol 12.5 mg twice daily.  Thanks MJP

## 2023-01-29 NOTE — Progress Notes (Signed)
Please see separate result about heart monitor.  While symptoms of rapid heartbeat can be managed with medications as noted in separate Zio patch result note, one episode of ventricular tachycardia is more significant now that I know that he has high calcium score.  Recommend cardiac PET/CT stress test given high calcium score, including in left main.  Elder Negus, MD

## 2023-02-03 DIAGNOSIS — K080 Exfoliation of teeth due to systemic causes: Secondary | ICD-10-CM | POA: Diagnosis not present

## 2023-02-04 ENCOUNTER — Encounter (HOSPITAL_COMMUNITY): Payer: Self-pay

## 2023-02-04 ENCOUNTER — Other Ambulatory Visit: Payer: Self-pay

## 2023-02-04 ENCOUNTER — Emergency Department (HOSPITAL_COMMUNITY): Payer: Medicare Other

## 2023-02-04 ENCOUNTER — Emergency Department (HOSPITAL_COMMUNITY)
Admission: EM | Admit: 2023-02-04 | Discharge: 2023-02-04 | Disposition: A | Discharge status: Home / Self Care | Payer: Medicare Other | Attending: Emergency Medicine | Admitting: Emergency Medicine

## 2023-02-04 DIAGNOSIS — H34812 Central retinal vein occlusion, left eye, with macular edema: Secondary | ICD-10-CM | POA: Diagnosis not present

## 2023-02-04 DIAGNOSIS — Z7982 Long term (current) use of aspirin: Secondary | ICD-10-CM | POA: Diagnosis not present

## 2023-02-04 DIAGNOSIS — H35711 Central serous chorioretinopathy, right eye: Secondary | ICD-10-CM | POA: Diagnosis not present

## 2023-02-04 DIAGNOSIS — H538 Other visual disturbances: Secondary | ICD-10-CM | POA: Diagnosis not present

## 2023-02-04 DIAGNOSIS — R29818 Other symptoms and signs involving the nervous system: Secondary | ICD-10-CM | POA: Diagnosis not present

## 2023-02-04 DIAGNOSIS — Z7902 Long term (current) use of antithrombotics/antiplatelets: Secondary | ICD-10-CM | POA: Insufficient documentation

## 2023-02-04 DIAGNOSIS — H34212 Partial retinal artery occlusion, left eye: Secondary | ICD-10-CM | POA: Diagnosis not present

## 2023-02-04 DIAGNOSIS — I10 Essential (primary) hypertension: Secondary | ICD-10-CM | POA: Diagnosis not present

## 2023-02-04 DIAGNOSIS — Z79899 Other long term (current) drug therapy: Secondary | ICD-10-CM | POA: Diagnosis not present

## 2023-02-04 DIAGNOSIS — I6522 Occlusion and stenosis of left carotid artery: Secondary | ICD-10-CM | POA: Diagnosis not present

## 2023-02-04 DIAGNOSIS — H353112 Nonexudative age-related macular degeneration, right eye, intermediate dry stage: Secondary | ICD-10-CM | POA: Diagnosis not present

## 2023-02-04 DIAGNOSIS — H3412 Central retinal artery occlusion, left eye: Secondary | ICD-10-CM | POA: Diagnosis not present

## 2023-02-04 DIAGNOSIS — H539 Unspecified visual disturbance: Secondary | ICD-10-CM | POA: Diagnosis not present

## 2023-02-04 LAB — DIFFERENTIAL
Abs Immature Granulocytes: 0.02 10*3/uL (ref 0.00–0.07)
Basophils Absolute: 0 10*3/uL (ref 0.0–0.1)
Basophils Relative: 0 %
Eosinophils Absolute: 0 10*3/uL (ref 0.0–0.5)
Eosinophils Relative: 0 %
Immature Granulocytes: 0 %
Lymphocytes Relative: 35 %
Lymphs Abs: 1.8 10*3/uL (ref 0.7–4.0)
Monocytes Absolute: 0.4 10*3/uL (ref 0.1–1.0)
Monocytes Relative: 8 %
Neutro Abs: 2.9 10*3/uL (ref 1.7–7.7)
Neutrophils Relative %: 57 %

## 2023-02-04 LAB — URINALYSIS, ROUTINE W REFLEX MICROSCOPIC
Bilirubin Urine: NEGATIVE
Glucose, UA: NEGATIVE mg/dL
Hgb urine dipstick: NEGATIVE
Ketones, ur: NEGATIVE mg/dL
Leukocytes,Ua: NEGATIVE
Nitrite: NEGATIVE
Protein, ur: NEGATIVE mg/dL
Specific Gravity, Urine: 1.01 (ref 1.005–1.030)
pH: 7 (ref 5.0–8.0)

## 2023-02-04 LAB — COMPREHENSIVE METABOLIC PANEL
ALT: 23 U/L (ref 0–44)
AST: 22 U/L (ref 15–41)
Albumin: 3.7 g/dL (ref 3.5–5.0)
Alkaline Phosphatase: 73 U/L (ref 38–126)
Anion gap: 8 (ref 5–15)
BUN: 14 mg/dL (ref 8–23)
CO2: 25 mmol/L (ref 22–32)
Calcium: 9.1 mg/dL (ref 8.9–10.3)
Chloride: 103 mmol/L (ref 98–111)
Creatinine, Ser: 0.86 mg/dL (ref 0.61–1.24)
GFR, Estimated: >60 mL/min (ref >60)
Glucose, Bld: 99 mg/dL (ref 70–99)
Potassium: 3.9 mmol/L (ref 3.5–5.1)
Sodium: 136 mmol/L (ref 135–145)
Total Bilirubin: 0.8 mg/dL (ref <1.2)
Total Protein: 6.4 g/dL — ABNORMAL LOW (ref 6.5–8.1)

## 2023-02-04 LAB — RAPID URINE DRUG SCREEN, HOSP PERFORMED
Amphetamines: NOT DETECTED
Barbiturates: NOT DETECTED
Benzodiazepines: NOT DETECTED
Cocaine: NOT DETECTED
Opiates: NOT DETECTED
Tetrahydrocannabinol: NOT DETECTED

## 2023-02-04 LAB — CBC
HCT: 38.7 % — ABNORMAL LOW (ref 39.0–52.0)
Hemoglobin: 12.6 g/dL — ABNORMAL LOW (ref 13.0–17.0)
MCH: 29.2 pg (ref 26.0–34.0)
MCHC: 32.6 g/dL (ref 30.0–36.0)
MCV: 89.6 fL (ref 80.0–100.0)
Platelets: 238 10*3/uL (ref 150–400)
RBC: 4.32 MIL/uL (ref 4.22–5.81)
RDW: 17.6 % — ABNORMAL HIGH (ref 11.5–15.5)
WBC: 5.1 10*3/uL (ref 4.0–10.5)
nRBC: 0 % (ref 0.0–0.2)

## 2023-02-04 LAB — PROTIME-INR
INR: 1 (ref 0.8–1.2)
Prothrombin Time: 13.8 s (ref 11.4–15.2)

## 2023-02-04 LAB — APTT: aPTT: 32 s (ref 24–36)

## 2023-02-04 LAB — ETHANOL: Alcohol, Ethyl (B): <10 mg/dL (ref <10)

## 2023-02-04 MED ORDER — CLOPIDOGREL BISULFATE 75 MG PO TABS: 75.0000 mg | ORAL_TABLET | Freq: Every day | ORAL | 0 refills | Status: DC

## 2023-02-04 MED ORDER — CLOPIDOGREL BISULFATE 300 MG PO TABS: 300.0000 mg | ORAL_TABLET | Freq: Once | ORAL | 0 refills | Status: AC

## 2023-02-04 MED ORDER — ASPIRIN 81 MG PO TBEC: 81.0000 mg | DELAYED_RELEASE_TABLET | Freq: Every day | ORAL | 0 refills | Status: DC

## 2023-02-04 MED ORDER — ASPIRIN 325 MG PO TBEC: 325.0000 mg | DELAYED_RELEASE_TABLET | Freq: Once | ORAL | 0 refills | Status: AC

## 2023-02-04 MED ORDER — GADOBUTROL 1 MMOL/ML IV SOLN
8.0000 mL | Freq: Once | INTRAVENOUS | Status: AC | PRN
  Administered 2023-02-04: 8 mL via INTRAVENOUS

## 2023-02-04 NOTE — Discharge Instructions (Addendum)
It was our pleasure to provide your ER care today - we hope that you feel better.  We discussed your case with our neurologists and vascular surgeon. They indicate for you to take aspirin and plavix as prescribed.  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   Return to ER right away if worse, new symptoms, new change in speech or vision, one-sided numbness/weakness, or other concern.

## 2023-02-04 NOTE — ED Triage Notes (Addendum)
Pt went to ophthalmologist for blurry vision. 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 Left eye is blurry. Right eye is blurry but that is from previous diagnosis. Denies headaches or any other stroke like symptoms. Grips equal and strong. Face symmetrical. LSW 2-3 weeks ago.

## 2023-02-04 NOTE — ED Provider Notes (Signed)
[place] EMERGENCY DEPARTMENT AT [facility] Provider Note   CSN: [id number] Arrival date & time: 02/04/23  1410     History  Chief Complaint  Patient presents with   Blurred Vision    Larry Holloway is a 72 y.o. male.  Pt indicates has left eye blurry vision in past two weeks. Indicates things are not as bright and are diffusely blurry when looking out of left eye in past couple weeks. Indicates went back to eye doctor today and was told to go to ER. 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 Denies acute change in vision today. No eye pain. Denies any recent change in speech. No new numbness, weakness, or loss of normal functional ability. No problems w balance or gait.   The history is provided by the patient and medical records.       Home Medications Prior to Admission medications   Medication Sig Start Date End Date Taking? Authorizing Provider  aspirin EC 325 MG tablet Take 1 tablet (325 mg total) by mouth once for 1 dose. 02/04/23 02/04/23 Yes Cathren Laine, MD  clopidogrel (PLAVIX) 300 MG TABS tablet Take 1 tablet (300 mg total) by mouth once for 1 dose. 02/04/23 02/04/23 Yes Cathren Laine, MD  acetaminophen (TYLENOL) 650 MG CR tablet 2 tablets as needed Orally once a day    [provider]  Acetaminophen 500 MG capsule 2 capsules as needed Orally once a day    [provider]  ALPRAZolam (XANAX) 0.25 MG tablet Take 0.25-0.5 mg by mouth 2 (two) times daily as needed (Restless leg).    [provider]  amLODipine (NORVASC) 5 MG tablet Take 7.5 mg by mouth daily.    [provider]  Ascorbic Acid (VITAMIN C) 1000 MG tablet Take 1,000 mg by mouth daily.    [provider]  cholecalciferol (VITAMIN D3) 25 MCG (1000 UNIT) tablet Take 1,000 Units by mouth daily.    [provider]  DULoxetine (CYMBALTA) 60 MG capsule Take 60 mg by mouth daily.    [provider]  ferrous  sulfate 325 (65 FE) MG tablet Take 325 mg by mouth daily with breakfast.    [provider]  fluticasone (FLONASE) 50 MCG/ACT nasal spray 50 sprays. 04/29/12   [provider]  L-Methylfolate 15 MG TABS Take 7.5 mg by mouth daily. 06/23/22   [provider]  loratadine (CLARITIN) 10 MG tablet Take 10 mg by mouth daily.    [provider]  Multiple Vitamins-Minerals (MULTIVITAMIN PO) Take 1 tablet by mouth daily. One a day    [provider]  omeprazole (PRILOSEC) 20 MG capsule Take 20 mg by mouth daily.    [provider]  oxyCODONE-acetaminophen (PERCOCET/ROXICET) 5-325 MG tablet Take 1 tablet by mouth every 4 (four) hours as needed for moderate pain or severe pain. 08/17/22   Barnett Abu, MD  pramipexole (MIRAPEX) 0.5 MG tablet Take 0.5 mg by mouth 2 (two) times daily. 05/06/22   [provider]  rosuvastatin (CRESTOR) 20 MG tablet Take 20 mg by mouth at bedtime.    [provider]      Allergies    Tape and Wound dressing adhesive    Review of Systems   Review of Systems  Constitutional:  Negative for fever.  HENT:  Negative for sore throat.   Eyes:  Positive for visual disturbance. Negative for redness.  Respiratory:  Negative for shortness of breath.  Cardiovascular:  Negative for chest pain.  Gastrointestinal:  Negative for abdominal pain, nausea and vomiting.  Genitourinary:  Negative for flank pain.  Musculoskeletal:  Negative for neck pain.  Neurological:  Negative for speech difficulty, weakness, numbness and headaches.    Physical Exam Updated Vital Signs BP 138/81   Pulse 75   Temp 97.7 F (36.5 C) (Oral)   Resp 16   Ht 1.778 m (5\' 10" )   Wt 83.9 kg   SpO2 98%   BMI 26.54 kg/m  Physical Exam Vitals and nursing note reviewed.  Constitutional:      Appearance: Normal appearance. He is well-developed.  HENT:     Head: Atraumatic.     Nose: Nose normal.     Mouth/Throat:     Mouth: Mucous  membranes are moist.     Pharynx: Oropharynx is clear.  Eyes:     General: No scleral icterus.    Extraocular Movements: Extraocular movements intact.     Conjunctiva/sclera: Conjunctivae normal.     Comments: Left eye dilated (buy ophthalmologist today). Eomi. No hem. ?pale retina.   Neck:     Vascular: No carotid bruit.     Trachea: No tracheal deviation.  Cardiovascular:     Rate and Rhythm: Normal rate and regular rhythm.     Pulses: Normal pulses.     Heart sounds: Normal heart sounds. No murmur heard.    No friction rub. No gallop.  Pulmonary:     Effort: Pulmonary effort is normal. No accessory muscle usage or respiratory distress.     Breath sounds: Normal breath sounds.  Abdominal:     General: Bowel sounds are normal. There is no distension.     Palpations: Abdomen is soft.     Tenderness: There is no abdominal tenderness.  Musculoskeletal:        General: No swelling or tenderness.     Cervical back: Normal range of motion and neck supple. No rigidity.  Skin:    General: Skin is warm and dry.     Findings: No rash.  Neurological:     Mental Status: He is alert.     Comments: Alert, speech clear. Motor/sens grossly intact bil. Stre 5/5. No pronator drift. Steady gait.   Psychiatric:        Mood and Affect: Mood normal.     ED Results / Procedures / Treatments   Labs (all labs ordered are listed, but only abnormal results are displayed) Results for orders placed or performed during the hospital encounter of 02/04/23  Ethanol  Result Value Ref Range   Alcohol, Ethyl (B) <10 <10 mg/dL  Protime-INR  Result Value Ref Range   Prothrombin Time 13.8 11.4 - 15.2 seconds   INR 1.0 0.8 - 1.2  APTT  Result Value Ref Range   aPTT 32 24 - 36 seconds  CBC  Result Value Ref Range   WBC 5.1 4.0 - 10.5 K/uL   RBC 4.32 4.22 - 5.81 MIL/uL   Hemoglobin 12.6 (L) 13.0 - 17.0 g/dL   HCT 16.1 (L) 09.6 - 04.5 %   MCV 89.6 80.0 - 100.0 fL   MCH 29.2 26.0 - 34.0 pg   MCHC 32.6  30.0 - 36.0 g/dL   RDW 40.9 (H) 81.1 - 91.4 %   Platelets 238 150 - 400 K/uL   nRBC 0.0 0.0 - 0.2 %  Differential  Result Value Ref Range   Neutrophils Relative % 57 %   Neutro Abs 2.9 1.7 -  7.7 K/uL   Lymphocytes Relative 35 %   Lymphs Abs 1.8 0.7 - 4.0 K/uL   Monocytes Relative 8 %   Monocytes Absolute 0.4 0.1 - 1.0 K/uL   Eosinophils Relative 0 %   Eosinophils Absolute 0.0 0.0 - 0.5 K/uL   Basophils Relative 0 %   Basophils Absolute 0.0 0.0 - 0.1 K/uL   Immature Granulocytes 0 %   Abs Immature Granulocytes 0.02 0.00 - 0.07 K/uL  Comprehensive metabolic panel  Result Value Ref Range   Sodium 136 135 - 145 mmol/L   Potassium 3.9 3.5 - 5.1 mmol/L   Chloride 103 98 - 111 mmol/L   CO2 25 22 - 32 mmol/L   Glucose, Bld 99 70 - 99 mg/dL   BUN 14 8 - 23 mg/dL   Creatinine, Ser 1.61 0.61 - 1.24 mg/dL   Calcium 9.1 8.9 - 09.6 mg/dL   Total Protein 6.4 (L) 6.5 - 8.1 g/dL   Albumin 3.7 3.5 - 5.0 g/dL   AST 22 15 - 41 U/L   ALT 23 0 - 44 U/L   Alkaline Phosphatase 73 38 - 126 U/L   Total Bilirubin 0.8 <1.2 mg/dL   GFR, Estimated >04 >54 mL/min   Anion gap 8 5 - 15  Urine rapid drug screen (hosp performed)  Result Value Ref Range   Opiates NONE DETECTED NONE DETECTED   Cocaine NONE DETECTED NONE DETECTED   Benzodiazepines NONE DETECTED NONE DETECTED   Amphetamines NONE DETECTED NONE DETECTED   Tetrahydrocannabinol NONE DETECTED NONE DETECTED   Barbiturates NONE DETECTED NONE DETECTED  Urinalysis, Routine w reflex microscopic -Urine, Clean Catch  Result Value Ref Range   Color, Urine YELLOW YELLOW   APPearance CLEAR CLEAR   Specific Gravity, Urine 1.010 1.005 - 1.030   pH 7.0 5.0 - 8.0   Glucose, UA NEGATIVE NEGATIVE mg/dL   Hgb urine dipstick NEGATIVE NEGATIVE   Bilirubin Urine NEGATIVE NEGATIVE   Ketones, ur NEGATIVE NEGATIVE mg/dL   Protein, ur NEGATIVE NEGATIVE mg/dL   Nitrite NEGATIVE NEGATIVE   Leukocytes,Ua NEGATIVE NEGATIVE   MR Brain Wo Contrast (neuro  protocol)  Result Date: 02/04/2023 CLINICAL DATA:  Vision changes EXAM: MRI HEAD WITHOUT CONTRAST MRA HEAD WITHOUT CONTRAST MRA NECK WITHOUT AND WITH CONTRAST TECHNIQUE: Multiplanar, multi-echo pulse sequences of the brain and surrounding structures were acquired without intravenous contrast. Angiographic images of the Circle of Willis were acquired using MRA technique without intravenous contrast. Angiographic images of the neck were acquired using MRA technique without and with intravenous contrast. Carotid stenosis measurements (when applicable) are obtained utilizing NASCET criteria, using the distal internal carotid diameter as the denominator. CONTRAST:  8mL GADAVIST GADOBUTROL 1 MMOL/ML IV SOLN COMPARISON:  02/16/2020 FINDINGS: MRI HEAD FINDINGS Brain: No acute infarct, mass effect or extra-axial collection. No chronic microhemorrhage or siderosis. There is multifocal hyperintense T2-weighted signal within the white matter. Parenchymal volume and CSF spaces are normal. The midline structures are normal. Vascular: Normal flow voids Skull and upper cervical spine: Normal Sinuses/Orbits: Paranasal sinuses are clear. No mastoid effusion. Normal orbits. Ocular lens replacements. Other: None MRA HEAD FINDINGS POSTERIOR CIRCULATION: --Vertebral arteries: Normal --Inferior cerebellar arteries: Normal. --Basilar artery: Normal. --Superior cerebellar arteries: Normal. --Posterior cerebral arteries: Normal. ANTERIOR CIRCULATION: --Intracranial internal carotid arteries: Normal. --Anterior cerebral arteries (ACA): Normal. --Middle cerebral arteries (MCA): Normal. Anatomic variants: None MRA NECK FINDINGS Postcontrast imaging degraded by poor bolus timing Aortic arch: Normal 3 vessel branching pattern. Visualized subclavian arteries are normal. Right carotid system:  No dissection, occlusion or stenosis. Left carotid system: Approximately 70% stenosis at the distal common carotid artery, just proximal to the bifurcation.  The ICA is normal. Vertebral arteries: Codominant system.  Normal. Other: None IMPRESSION: 1. No acute intracranial abnormality. 2. Normal intracranial MRA. 3. Approximately 70% stenosis of the distal left common carotid artery, just proximal to the bifurcation. Electronically Signed   By: Deatra Robinson M.D.   On: 02/04/2023 22:12   MR ANGIO HEAD WO CONTRAST  Result Date: 02/04/2023 CLINICAL DATA:  Vision changes EXAM: MRI HEAD WITHOUT CONTRAST MRA HEAD WITHOUT CONTRAST MRA NECK WITHOUT AND WITH CONTRAST TECHNIQUE: Multiplanar, multi-echo pulse sequences of the brain and surrounding structures were acquired without intravenous contrast. Angiographic images of the Circle of Willis were acquired using MRA technique without intravenous contrast. Angiographic images of the neck were acquired using MRA technique without and with intravenous contrast. Carotid stenosis measurements (when applicable) are obtained utilizing NASCET criteria, using the distal internal carotid diameter as the denominator. CONTRAST:  8mL GADAVIST GADOBUTROL 1 MMOL/ML IV SOLN COMPARISON:  02/16/2020 FINDINGS: MRI HEAD FINDINGS Brain: No acute infarct, mass effect or extra-axial collection. No chronic microhemorrhage or siderosis. There is multifocal hyperintense T2-weighted signal within the white matter. Parenchymal volume and CSF spaces are normal. The midline structures are normal. Vascular: Normal flow voids Skull and upper cervical spine: Normal Sinuses/Orbits: Paranasal sinuses are clear. No mastoid effusion. Normal orbits. Ocular lens replacements. Other: None MRA HEAD FINDINGS POSTERIOR CIRCULATION: --Vertebral arteries: Normal --Inferior cerebellar arteries: Normal. --Basilar artery: Normal. --Superior cerebellar arteries: Normal. --Posterior cerebral arteries: Normal. ANTERIOR CIRCULATION: --Intracranial internal carotid arteries: Normal. --Anterior cerebral arteries (ACA): Normal. --Middle cerebral arteries (MCA): Normal. Anatomic  variants: None MRA NECK FINDINGS Postcontrast imaging degraded by poor bolus timing Aortic arch: Normal 3 vessel branching pattern. Visualized subclavian arteries are normal. Right carotid system: No dissection, occlusion or stenosis. Left carotid system: Approximately 70% stenosis at the distal common carotid artery, just proximal to the bifurcation. The ICA is normal. Vertebral arteries: Codominant system.  Normal. Other: None IMPRESSION: 1. No acute intracranial abnormality. 2. Normal intracranial MRA. 3. Approximately 70% stenosis of the distal left common carotid artery, just proximal to the bifurcation. Electronically Signed   By: Deatra Robinson M.D.   On: 02/04/2023 22:12   MR Angiogram Neck W or Wo Contrast  Result Date: 02/04/2023 CLINICAL DATA:  Vision changes EXAM: MRI HEAD WITHOUT CONTRAST MRA HEAD WITHOUT CONTRAST MRA NECK WITHOUT AND WITH CONTRAST TECHNIQUE: Multiplanar, multi-echo pulse sequences of the brain and surrounding structures were acquired without intravenous contrast. Angiographic images of the Circle of Willis were acquired using MRA technique without intravenous contrast. Angiographic images of the neck were acquired using MRA technique without and with intravenous contrast. Carotid stenosis measurements (when applicable) are obtained utilizing NASCET criteria, using the distal internal carotid diameter as the denominator. CONTRAST:  8mL GADAVIST GADOBUTROL 1 MMOL/ML IV SOLN COMPARISON:  02/16/2020 FINDINGS: MRI HEAD FINDINGS Brain: No acute infarct, mass effect or extra-axial collection. No chronic microhemorrhage or siderosis. There is multifocal hyperintense T2-weighted signal within the white matter. Parenchymal volume and CSF spaces are normal. The midline structures are normal. Vascular: Normal flow voids Skull and upper cervical spine: Normal Sinuses/Orbits: Paranasal sinuses are clear. No mastoid effusion. Normal orbits. Ocular lens replacements. Other: None MRA HEAD FINDINGS  POSTERIOR CIRCULATION: --Vertebral arteries: Normal --Inferior cerebellar arteries: Normal. --Basilar artery: Normal. --Superior cerebellar arteries: Normal. --Posterior cerebral arteries: Normal. ANTERIOR CIRCULATION: --Intracranial internal carotid arteries: Normal. --Anterior cerebral arteries (  ACA): Normal. --Middle cerebral arteries (MCA): Normal. Anatomic variants: None MRA NECK FINDINGS Postcontrast imaging degraded by poor bolus timing Aortic arch: Normal 3 vessel branching pattern. Visualized subclavian arteries are normal. Right carotid system: No dissection, occlusion or stenosis. Left carotid system: Approximately 70% stenosis at the distal common carotid artery, just proximal to the bifurcation. The ICA is normal. Vertebral arteries: Codominant system.  Normal. Other: None IMPRESSION: 1. No acute intracranial abnormality. 2. Normal intracranial MRA. 3. Approximately 70% stenosis of the distal left common carotid artery, just proximal to the bifurcation. Electronically Signed   By: Deatra Robinson M.D.   On: 02/04/2023 22:12   CT HEAD WO CONTRAST  Result Date: 02/04/2023 CLINICAL DATA:  Neuro deficit, acute, stroke suspected. Left eye blurry vision. EXAM: CT HEAD WITHOUT CONTRAST TECHNIQUE: Contiguous axial images were obtained from the base of the skull through the vertex without intravenous contrast. RADIATION DOSE REDUCTION: This exam was performed according to the departmental dose-optimization program which includes automated exposure control, adjustment of the mA and/or kV according to patient size and/or use of iterative reconstruction technique. COMPARISON:  Head MRI 02/16/2020 FINDINGS: Brain: There is no evidence of an acute infarct, intracranial hemorrhage, mass, midline shift, or extra-axial fluid collection. Scattered punctate sulcal or superficial cortical calcifications are noted over the left cerebral convexity. There is a chronic lacunar infarct along the lateral margin of the left  caudate head. Cerebral volume is normal for age with normal ventricular size. Vascular: Calcified atherosclerosis at the skull base. No hyperdense vessel. Skull: No acute fracture or suspicious osseous lesion. Sinuses/Orbits: Visualized paranasal sinuses and mastoid air cells are clear. Bilateral cataract extraction. Other: None. IMPRESSION: 1. No evidence of acute intracranial abnormality. 2. Chronic left caudate lacunar infarct. Electronically Signed   By: Sebastian Ache M.D.   On: 02/04/2023 17:20   LONG TERM MONITOR (3-14 DAYS)  Result Date: 01/29/2023 Zio patch monitor 13 days 01/05/2023 - 01/19/2023: Dominant rhythm: Sinus. HR 55-111 bpm. Avg HR 76 bpm, in sinus rhythm. 12 episodes of SVT/atrial tachycardia, fastest at 150 bpm for 4 beats, longest for 20.3 secs at 101 bpm. 1.5% isolated SVE, <1% couplets./triplets. 1 episode of VT, fastest at 139 bpm for 9 beats. 4.3% isolated VE, <1% couplet/triplets. No atrial fibrillation/atrial flutter/high grade AV block, sinus pause >3sec noted. 2 patient triggered events, correlated with SVE/VE.  CT CARDIAC SCORING  Result Date: 01/29/2023 CLINICAL DATA:  Cardiovascular Disease Risk stratification EXAM: Coronary Calcium Score TECHNIQUE: A gated, non-contrast computed tomography scan of the heart was performed using 3mm slice thickness. Axial images were analyzed on a dedicated workstation. Calcium scoring of the coronary arteries was performed using the Agatston method. FINDINGS: Coronary arteries: Normal origins. Coronary Calcium Score: Left main: 121 Left anterior descending artery: 1114 Left circumflex artery: 152 Right coronary artery: 10.8 Total: 1398 Percentile: 88th Pericardium: Normal. Ascending Aorta: Mildly dilated measuring 41mm at the level of the bifurcation of the main pulmonary artery. Recommend dedicated gated chest CTA for further evaluation. Non-cardiac: See separate report from [facility]. IMPRESSION: Coronary calcium score of 1398.  This was 88th percentile for age-, race-, and sex-matched controls. Mildly dilated measuring 41mm at the level of the bifurcation of the main pulmonary artery. Recommend dedicated gated chest CTA for further evaluation. RECOMMENDATIONS: Coronary artery calcium (CAC) score is a strong predictor of incident coronary heart disease (CHD) and provides predictive information beyond traditional risk factors. CAC scoring is reasonable to use in the decision to withhold, postpone, or initiate statin therapy  in intermediate-risk or selected borderline-risk asymptomatic adults (age 51-75 years and LDL-C >=70 to <190 mg/dL) who do not have diabetes or established atherosclerotic cardiovascular disease (ASCVD).* In intermediate-risk (10-year ASCVD risk >=7.5% to <20%) adults or selected borderline-risk (10-year ASCVD risk >=5% to <7.5%) adults in whom a CAC score is measured for the purpose of making a treatment decision the following recommendations have been made: If CAC=0, it is reasonable to withhold statin therapy and reassess in 5 to 10 years, as long as higher risk conditions are absent (diabetes mellitus, family history of premature CHD in first degree relatives (males <55 years; females <65 years), cigarette smoking, or LDL >=190 mg/dL). If CAC is 1 to 99, it is reasonable to initiate statin therapy for patients >=4 years of age. If CAC is >=100 or >=75th percentile, it is reasonable to initiate statin therapy at any age. Cardiology referral should be considered for patients with CAC scores >=400 or >=75th percentile. *2018 AHA/ACC/AACVPR/AAPA/ABC/ACPM/ADA/AGS/APhA/ASPC/NLA/PCNA Guideline on the Management of Blood Cholesterol: A Report of the American College of Cardiology/American Heart Association Task Force on Clinical Practice Guidelines. J Am Coll Cardiol. 2019;73(24):3168-3209. Armanda Magic, MD Electronically Signed   By: Armanda Magic M.D.   On: 01/29/2023 10:50   ECHOCARDIOGRAM COMPLETE  Result Date:  01/21/2023    ECHOCARDIOGRAM REPORT   Patient Name:   Larry Holloway Date of Exam: 01/21/2023 Medical Rec #:  [id number]        Height:       70.0 in Accession #:    [id number]       Weight:       182.0 lb Date of Birth:  02/23/1951        BSA:          2.005 m Patient Age:    72 years         BP:           142/60 mmHg Patient Gender: M                HR:           77 bpm. Exam Location:  [address] Procedure: 2D Echo, 3D Echo, Cardiac Doppler, Color Doppler and Strain Analysis Indications:    R06.00 Dyspnea  History:        Patient has no prior history of Echocardiogram examinations.                 Risk Factors:Hypertension and Dyslipidemia.  Sonographer:    Daphine Deutscher RDCS Referring Phys: [id number] [facility] J PATWARDHAN IMPRESSIONS  1. Left ventricular ejection fraction, by estimation, is 60 to 65%. The left ventricle has normal function. The left ventricle has no regional wall motion abnormalities. Left ventricular diastolic parameters are consistent with Grade I diastolic dysfunction (impaired relaxation). The average left ventricular global longitudinal strain is -23.8 %. The global longitudinal strain is normal.  2. Right ventricular systolic function is normal. The right ventricular size is mildly enlarged.  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 No evidence of mitral stenosis. 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  4. The aortic valve is tricuspid. Aortic valve regurgitation is not visualized. Aortic valve sclerosis/calcification is present, without any evidence of aortic stenosis.  5. Aortic dilatation noted. There is mild dilatation of the aortic root, measuring 38 mm. There is mild dilatation of the ascending aorta, measuring 41 mm.  6. The inferior vena cava is  normal in size with greater than 50% respiratory variability, suggesting right atrial pressure of 3 mmHg. FINDINGS  Left Ventricle:  Left ventricular ejection fraction, by estimation, is 60 to 65%. The left ventricle has normal function. The left ventricle has no regional wall motion abnormalities. The average left ventricular global longitudinal strain is -23.8 %. The global longitudinal strain is normal. The left ventricular internal cavity size was normal in size. There is no left ventricular hypertrophy. Left ventricular diastolic parameters are consistent with Grade I diastolic dysfunction (impaired relaxation). Normal left ventricular filling pressure. Right Ventricle: The right ventricular size is mildly enlarged. No increase in right ventricular wall thickness. Right ventricular systolic function is normal. Left Atrium: Left atrial size was normal in size. Right Atrium: Right atrial size was normal in size. Pericardium: There is no evidence of pericardial effusion. 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 Mild mitral annular calcification. Mild mitral valve regurgitation, with eccentric posteriorly directed jet. No evidence of mitral valve stenosis. Tricuspid Valve: The tricuspid valve is normal in structure. Tricuspid valve regurgitation is mild . No evidence of tricuspid stenosis. Aortic Valve: The aortic valve is tricuspid. Aortic valve regurgitation is not visualized. Aortic valve sclerosis/calcification is present, without any evidence of aortic stenosis. Pulmonic Valve: The pulmonic valve was normal in structure. Pulmonic valve regurgitation is trivial. No evidence of pulmonic stenosis. Aorta: Aortic dilatation noted. There is mild dilatation of the aortic root, measuring 38 mm. There is mild dilatation of the ascending aorta, measuring 41 mm. Venous: The inferior vena cava is normal in size with greater than 50% respiratory variability, suggesting right atrial pressure of 3 mmHg. IAS/Shunts: No atrial level shunt  detected by color flow Doppler.  LEFT VENTRICLE PLAX 2D LVIDd:         4.70 cm   Diastology LVIDs:         2.70 cm   LV e' medial:    8.99 cm/s LV PW:         1.00 cm   LV E/e' medial:  7.3 LV IVS:        1.00 cm   LV e' lateral:   8.34 cm/s LVOT diam:     2.40 cm   LV E/e' lateral: 7.9 LV SV:         71 LV SV Index:   35        2D Longitudinal Strain LVOT Area:     4.52 cm  2D Strain GLS (A2C):   -24.2 %                          2D Strain GLS (A3C):   -23.3 %                          2D Strain GLS (A4C):   -24.0 %                          2D Strain GLS Avg:     -23.8 %                           3D Volume EF:  3D EF:        56 %                          LV EDV:       147 ml                          LV ESV:       65 ml                          LV SV:        82 ml RIGHT VENTRICLE RV Basal diam:  4.20 cm RV S prime:     18.83 cm/s TAPSE (M-mode): 2.9 cm LEFT ATRIUM             Index        RIGHT ATRIUM           Index LA diam:        5.30 cm 2.64 cm/m   RA Area:     13.40 cm LA Vol (A2C):   59.0 ml 29.42 ml/m  RA Volume:   30.10 ml  15.01 ml/m LA Vol (A4C):   44.6 ml 22.24 ml/m LA Biplane Vol: 51.2 ml 25.53 ml/m  AORTIC VALVE LVOT Vmax:   75.45 cm/s LVOT Vmean:  51.650 cm/s LVOT VTI:    0.156 m  AORTA Ao Root diam: 3.80 cm Ao Asc diam:  4.10 cm MITRAL VALVE               TRICUSPID VALVE MV Area (PHT): 3.39 cm    TR Peak grad:   26.0 mmHg MV Decel Time: 224 msec    TR Vmax:        255.00 cm/s MV E velocity: 65.80 cm/s MV A velocity: 89.05 cm/s  SHUNTS MV E/A ratio:  0.74        Systemic VTI:  0.16 m                            Systemic Diam: 2.40 cm Armanda Magic MD Electronically signed by Armanda Magic MD Signature Date/Time: 01/21/2023/8:24:41 PM    Final     EKG Nsr, rate 71, pac, no acute st/t changes.   Radiology MR Brain Wo Contrast (neuro protocol)  Result Date: 02/04/2023 CLINICAL DATA:  Vision changes EXAM: MRI HEAD WITHOUT CONTRAST MRA HEAD WITHOUT CONTRAST MRA NECK  WITHOUT AND WITH CONTRAST TECHNIQUE: Multiplanar, multi-echo pulse sequences of the brain and surrounding structures were acquired without intravenous contrast. Angiographic images of the Circle of Willis were acquired using MRA technique without intravenous contrast. Angiographic images of the neck were acquired using MRA technique without and with intravenous contrast. Carotid stenosis measurements (when applicable) are obtained utilizing NASCET criteria, using the distal internal carotid diameter as the denominator. CONTRAST:  8mL GADAVIST GADOBUTROL 1 MMOL/ML IV SOLN COMPARISON:  02/16/2020 FINDINGS: MRI HEAD FINDINGS Brain: No acute infarct, mass effect or extra-axial collection. No chronic microhemorrhage or siderosis. There is multifocal hyperintense T2-weighted signal within the white matter. Parenchymal volume and CSF spaces are normal. The midline structures are normal. Vascular: Normal flow voids Skull and upper cervical spine: Normal Sinuses/Orbits: Paranasal sinuses are clear. No mastoid effusion. Normal orbits. Ocular lens replacements. Other: None MRA HEAD FINDINGS POSTERIOR CIRCULATION: --Vertebral arteries: Normal --Inferior cerebellar arteries: Normal. --Basilar artery: Normal. --Superior cerebellar  arteries: Normal. --Posterior cerebral arteries: Normal. ANTERIOR CIRCULATION: --Intracranial internal carotid arteries: Normal. --Anterior cerebral arteries (ACA): Normal. --Middle cerebral arteries (MCA): Normal. Anatomic variants: None MRA NECK FINDINGS Postcontrast imaging degraded by poor bolus timing Aortic arch: Normal 3 vessel branching pattern. Visualized subclavian arteries are normal. Right carotid system: No dissection, occlusion or stenosis. Left carotid system: Approximately 70% stenosis at the distal common carotid artery, just proximal to the bifurcation. The ICA is normal. Vertebral arteries: Codominant system.  Normal. Other: None IMPRESSION: 1. No acute intracranial abnormality. 2.  Normal intracranial MRA. 3. Approximately 70% stenosis of the distal left common carotid artery, just proximal to the bifurcation. Electronically Signed   By: Deatra Robinson M.D.   On: 02/04/2023 22:12   MR ANGIO HEAD WO CONTRAST  Result Date: 02/04/2023 CLINICAL DATA:  Vision changes EXAM: MRI HEAD WITHOUT CONTRAST MRA HEAD WITHOUT CONTRAST MRA NECK WITHOUT AND WITH CONTRAST TECHNIQUE: Multiplanar, multi-echo pulse sequences of the brain and surrounding structures were acquired without intravenous contrast. Angiographic images of the Circle of Willis were acquired using MRA technique without intravenous contrast. Angiographic images of the neck were acquired using MRA technique without and with intravenous contrast. Carotid stenosis measurements (when applicable) are obtained utilizing NASCET criteria, using the distal internal carotid diameter as the denominator. CONTRAST:  8mL GADAVIST GADOBUTROL 1 MMOL/ML IV SOLN COMPARISON:  02/16/2020 FINDINGS: MRI HEAD FINDINGS Brain: No acute infarct, mass effect or extra-axial collection. No chronic microhemorrhage or siderosis. There is multifocal hyperintense T2-weighted signal within the white matter. Parenchymal volume and CSF spaces are normal. The midline structures are normal. Vascular: Normal flow voids Skull and upper cervical spine: Normal Sinuses/Orbits: Paranasal sinuses are clear. No mastoid effusion. Normal orbits. Ocular lens replacements. Other: None MRA HEAD FINDINGS POSTERIOR CIRCULATION: --Vertebral arteries: Normal --Inferior cerebellar arteries: Normal. --Basilar artery: Normal. --Superior cerebellar arteries: Normal. --Posterior cerebral arteries: Normal. ANTERIOR CIRCULATION: --Intracranial internal carotid arteries: Normal. --Anterior cerebral arteries (ACA): Normal. --Middle cerebral arteries (MCA): Normal. Anatomic variants: None MRA NECK FINDINGS Postcontrast imaging degraded by poor bolus timing Aortic arch: Normal 3 vessel branching pattern.  Visualized subclavian arteries are normal. Right carotid system: No dissection, occlusion or stenosis. Left carotid system: Approximately 70% stenosis at the distal common carotid artery, just proximal to the bifurcation. The ICA is normal. Vertebral arteries: Codominant system.  Normal. Other: None IMPRESSION: 1. No acute intracranial abnormality. 2. Normal intracranial MRA. 3. Approximately 70% stenosis of the distal left common carotid artery, just proximal to the bifurcation. Electronically Signed   By: Deatra Robinson M.D.   On: 02/04/2023 22:12   MR Angiogram Neck W or Wo Contrast  Result Date: 02/04/2023 CLINICAL DATA:  Vision changes EXAM: MRI HEAD WITHOUT CONTRAST MRA HEAD WITHOUT CONTRAST MRA NECK WITHOUT AND WITH CONTRAST TECHNIQUE: Multiplanar, multi-echo pulse sequences of the brain and surrounding structures were acquired without intravenous contrast. Angiographic images of the Circle of Willis were acquired using MRA technique without intravenous contrast. Angiographic images of the neck were acquired using MRA technique without and with intravenous contrast. Carotid stenosis measurements (when applicable) are obtained utilizing NASCET criteria, using the distal internal carotid diameter as the denominator. CONTRAST:  8mL GADAVIST GADOBUTROL 1 MMOL/ML IV SOLN COMPARISON:  02/16/2020 FINDINGS: MRI HEAD FINDINGS Brain: No acute infarct, mass effect or extra-axial collection. No chronic microhemorrhage or siderosis. There is multifocal hyperintense T2-weighted signal within the white matter. Parenchymal volume and CSF spaces are normal. The midline structures are normal. Vascular: Normal flow voids Skull and upper cervical  spine: Normal Sinuses/Orbits: Paranasal sinuses are clear. No mastoid effusion. Normal orbits. Ocular lens replacements. Other: None MRA HEAD FINDINGS POSTERIOR CIRCULATION: --Vertebral arteries: Normal --Inferior cerebellar arteries: Normal. --Basilar artery: Normal. --Superior  cerebellar arteries: Normal. --Posterior cerebral arteries: Normal. ANTERIOR CIRCULATION: --Intracranial internal carotid arteries: Normal. --Anterior cerebral arteries (ACA): Normal. --Middle cerebral arteries (MCA): Normal. Anatomic variants: None MRA NECK FINDINGS Postcontrast imaging degraded by poor bolus timing Aortic arch: Normal 3 vessel branching pattern. Visualized subclavian arteries are normal. Right carotid system: No dissection, occlusion or stenosis. Left carotid system: Approximately 70% stenosis at the distal common carotid artery, just proximal to the bifurcation. The ICA is normal. Vertebral arteries: Codominant system.  Normal. Other: None IMPRESSION: 1. No acute intracranial abnormality. 2. Normal intracranial MRA. 3. Approximately 70% stenosis of the distal left common carotid artery, just proximal to the bifurcation. Electronically Signed   By: Deatra Robinson M.D.   On: 02/04/2023 22:12   CT HEAD WO CONTRAST  Result Date: 02/04/2023 CLINICAL DATA:  Neuro deficit, acute, stroke suspected. Left eye blurry vision. EXAM: CT HEAD WITHOUT CONTRAST TECHNIQUE: Contiguous axial images were obtained from the base of the skull through the vertex without intravenous contrast. RADIATION DOSE REDUCTION: This exam was performed according to the departmental dose-optimization program which includes automated exposure control, adjustment of the mA and/or kV according to patient size and/or use of iterative reconstruction technique. COMPARISON:  Head MRI 02/16/2020 FINDINGS: Brain: There is no evidence of an acute infarct, intracranial hemorrhage, mass, midline shift, or extra-axial fluid collection. Scattered punctate sulcal or superficial cortical calcifications are noted over the left cerebral convexity. There is a chronic lacunar infarct along the lateral margin of the left caudate head. Cerebral volume is normal for age with normal ventricular size. Vascular: Calcified atherosclerosis at the skull base.  No hyperdense vessel. Skull: No acute fracture or suspicious osseous lesion. Sinuses/Orbits: Visualized paranasal sinuses and mastoid air cells are clear. Bilateral cataract extraction. Other: None. IMPRESSION: 1. No evidence of acute intracranial abnormality. 2. Chronic left caudate lacunar infarct. Electronically Signed   By: Sebastian Ache M.D.   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 Labs: ordered. Decision-making details documented in ED Course. Radiology: ordered and independent interpretation performed. Decision-making details documented in ED Course. ECG/medicine tests: ordered and independent interpretation performed. Decision-making details documented in ED Course. Discussion of management or test interpretation with external provider(s): Neurology, vascular surgery  Risk OTC drugs. Prescription drug management. Decision regarding hospitalization.   Iv ns. Continuous pulse ox and cardiac monitoring. Labs ordered/sent. Imaging ordered.   Differential diagnosis includes retinal vessel occlusion, cva, etc. Dispo decision including potential need for admission considered - will get labs and imaging and reassess.   Reviewed nursing  notes and prior charts for additional history. External reports reviewed.  Neurology consulted   - discussed pt with Dr Selina Cooley - patient has had recent echo. She indicates would get mri/mra in ED - if no acute/critical process, would d/c to outpatient neurology f/u.   Cardiac monitor: sinus rhythm, rate 70.  Labs reviewed/interpreted by me - wbc 5, hgb 13. Chem normal.   CT reviewed/interpreted by me - no hem.   MRI reviewed/interpreted by me - 70% common carotid stenosis, prox to bifur.   Neurology re-consulted. Discussed pt. Sal indicates asa 325 and plavix 300 mg x 1 dose now, and then start on asa 81 mg a day and plavix 75 mg a day, and indicates to call vascular surgery to see if would rather admit vs f/u in office in a few days.   Discussed pt, ophthy eval, mri/mra, w Dr Lenell Antu - he agrees w starting on asa and plavix and indicates they can f/u in the office in the next few days.   Recheck pt, no new symptoms or change in exam. Meds given and dispo plan discussed.   Pt currently appears stable for d/c.   Return precautions provided.          Final Clinical Impression(s) / ED Diagnoses Final diagnoses:  None    Rx / DC Orders ED Discharge Orders          Ordered    aspirin EC 325 MG tablet   Once        02/04/23 2242    clopidogrel (PLAVIX) 300 MG TABS tablet   Once        02/04/23 2242              Cathren Laine, MD 02/04/23 2257

## 2023-02-04 NOTE — ED Provider Triage Note (Signed)
Emergency Medicine Provider Triage Evaluation Note  Tobe Sos , a 72 y.o. male  was evaluated in triage.  Pt complains of left-sided blurred vision.  Symptoms began approximately 3 weeks ago and have progressively gotten worse.  Was seen by Dr. Luciana Axe, ophthalmology 1 week ago and was diagnosed with CRVO.  Presented again today due to worsening symptoms and was encouraged to come to the emergency department for potential stroke.  Reports some blurred vision and black spots  Review of Systems  Positive: As above Negative: As above  Physical Exam  BP (!) 153/91 (BP Location: Right Arm)   Pulse 74   Temp 98 F (36.7 C)   Resp 16   Ht 5\' 10"  (1.778 m)   Wt 83.9 kg   SpO2 100%   BMI 26.54 kg/m  Gen:   Awake, no distress   Resp:  Normal effort  MSK:   Moves extremities without difficulty  Other:    Medical Decision Making  Medically screening exam initiated at 2:50 PM.  Appropriate orders placed.  Arad Burnard Leigh was informed that the remainder of the evaluation will be completed by another provider, this initial triage assessment does not replace that evaluation, and the importance of remaining in the ED until their evaluation is complete.  Workup initiated   Michelle Piper, Cordelia Poche 02/04/23 1452

## 2023-02-05 ENCOUNTER — Telehealth: Payer: Self-pay | Admitting: Cardiology

## 2023-02-05 NOTE — Telephone Encounter (Signed)
Patient called and said that he wants to make sure the doctor knows he has been diagnosed with a retinal occlusion and got a shot in his eye yesterday and was told it was due to a blockage of some type. Has some questions in regards to the having NM PET CT CAR PERF MULT ABS BF. Please call back or leave message

## 2023-02-05 NOTE — Telephone Encounter (Signed)
Ok

## 2023-02-05 NOTE — Telephone Encounter (Signed)
Will send this information to Dr. Rosemary Holms to review and advise on.  To my knowledge there shouldn't be any contraindication as to why the pt can't proceed with his scheduled Cardiac PET Scan for 12/4, with his recent diagnosis of retinal occlusion.  Will run this by Dr. Rosemary Holms to get his thoughts on this, and will follow-up with the pt accordingly thereafter.

## 2023-02-05 NOTE — Telephone Encounter (Signed)
Agree  Thanks MJP  

## 2023-02-05 NOTE — Telephone Encounter (Signed)
Pt aware that it is safe to proceed with scheduled Cardiac PET Scan for 12/4.  He also wanted to mention to Dr. Rosemary Holms that this all came about from a hospital visit yesterday for complaints of blurred vision.  He stated they did an MRA of his head and neck and incidentally found 70% stenosis in his left carotid.  Pt states the ER Doctor did refer him to Vascular Surgery Dr. Verita Lamb for this.   Pt states Dr. Verita Lamb office is currently working with him now on getting him an appointment with them.  Pt states he just wanted to make Dr. Rosemary Holms aware of all of this and he will proceed with scheduled Cardiac PET Scan for 12/4, and will see Vascular Dr. Verita Lamb as well, and follow his treatment plan.  Pt aware that I will share this additional information with Dr. Rosemary Holms.   Pt verbalized understanding and agrees with this plan.  Pt was more than gracious for all the assistance provided.

## 2023-02-11 DIAGNOSIS — H3412 Central retinal artery occlusion, left eye: Secondary | ICD-10-CM | POA: Diagnosis not present

## 2023-02-11 DIAGNOSIS — H35711 Central serous chorioretinopathy, right eye: Secondary | ICD-10-CM | POA: Diagnosis not present

## 2023-02-11 DIAGNOSIS — H353112 Nonexudative age-related macular degeneration, right eye, intermediate dry stage: Secondary | ICD-10-CM | POA: Diagnosis not present

## 2023-02-17 DIAGNOSIS — H3581 Retinal edema: Secondary | ICD-10-CM | POA: Diagnosis not present

## 2023-02-23 ENCOUNTER — Other Ambulatory Visit (HOSPITAL_COMMUNITY): Payer: Medicare Other

## 2023-02-23 ENCOUNTER — Encounter (HOSPITAL_COMMUNITY)
Admission: RE | Admit: 2023-02-23 | Discharge: 2023-02-23 | Disposition: A | Discharge status: Home / Self Care | Payer: Medicare Other | Source: Ambulatory Visit | Attending: Cardiology | Admitting: Cardiology

## 2023-02-23 DIAGNOSIS — I2510 Atherosclerotic heart disease of native coronary artery without angina pectoris: Secondary | ICD-10-CM | POA: Insufficient documentation

## 2023-02-23 LAB — NM PET CT CARDIAC PERFUSION MULTI W/ABSOLUTE BLOODFLOW
LV dias vol: 161 mL (ref 62–150)
MBFR: 1.72
Nuc Rest EF: 61 %
Nuc Stress EF: 68 %
Peak HR: 90 {beats}/min
Rest HR: 77 {beats}/min
Rest MBF: 0.99 ml/g/min
Rest Nuclear Isotope Dose: 21.2 mCi
Rest perfusion cavity size (mL): 161 mL
ST Depression (mm): 0 mm
Stress MBF: 1.7 ml/g/min
Stress Nuclear Isotope Dose: 21 mCi
Stress perfusion cavity size (mL): 167 mL
TID: 1.04

## 2023-02-23 MED ORDER — REGADENOSON 0.4 MG/5ML IV SOLN
INTRAVENOUS | Status: AC
  Filled 2023-02-23: qty 5

## 2023-02-23 MED ORDER — RUBIDIUM RB82 GENERATOR (RUBYFILL)
21.1000 | PACK | Freq: Once | INTRAVENOUS | Status: AC
  Administered 2023-02-23: 21.1 via INTRAVENOUS

## 2023-02-23 MED ORDER — RUBIDIUM RB82 GENERATOR (RUBYFILL)
21.2000 | PACK | Freq: Once | INTRAVENOUS | Status: AC
  Administered 2023-02-23: 21.2 via INTRAVENOUS

## 2023-02-23 MED ORDER — REGADENOSON 0.4 MG/5ML IV SOLN
0.4000 mg | Freq: Once | INTRAVENOUS | Status: AC
  Administered 2023-02-23: 0.4 mg via INTRAVENOUS

## 2023-02-23 NOTE — Progress Notes (Signed)
Tolerated test well 

## 2023-02-24 ENCOUNTER — Telehealth: Payer: Self-pay | Admitting: *Deleted

## 2023-02-24 ENCOUNTER — Ambulatory Visit: Payer: Medicare Other | Admitting: Vascular Surgery

## 2023-02-24 ENCOUNTER — Encounter: Payer: Self-pay | Admitting: Vascular Surgery

## 2023-02-24 VITALS — BP 152/89 | HR 77 | Temp 98.2°F | Ht 70.0 in | Wt 192.4 lb

## 2023-02-24 DIAGNOSIS — I2510 Atherosclerotic heart disease of native coronary artery without angina pectoris: Secondary | ICD-10-CM

## 2023-02-24 DIAGNOSIS — E782 Mixed hyperlipidemia: Secondary | ICD-10-CM

## 2023-02-24 DIAGNOSIS — I6522 Occlusion and stenosis of left carotid artery: Secondary | ICD-10-CM | POA: Diagnosis not present

## 2023-02-24 DIAGNOSIS — Z79899 Other long term (current) drug therapy: Secondary | ICD-10-CM

## 2023-02-24 MED ORDER — ROSUVASTATIN CALCIUM 40 MG PO TABS: 40.0000 mg | ORAL_TABLET | Freq: Every day | ORAL | 2 refills | Status: DC

## 2023-02-24 NOTE — Telephone Encounter (Signed)
Patwardhan, Anabel Bene, MD  Loa Socks, LPN Repeat lipid panel in 3 months.  Thanks MJP

## 2023-02-24 NOTE — Telephone Encounter (Signed)
-----   Message from [facility] sent at 02/24/2023  9:33 AM EST ----- Severe calcification but no significant blockages in the heart arteries.  Continue aggressive medical management.  I would aim to bring her LDL down below 55.  Therefore, recommend increasing Crestor to 40 mg daily.  Also noticed your recent ER visit.  Continue aspirin and Plavix and follow-up with neurology.  Thanks MJP

## 2023-02-24 NOTE — Telephone Encounter (Signed)
The patient has been notified of the result and verbalized understanding.  All questions (if any) were answered.    Pt aware to increase his crestor to 40 mg po daily and come in at the end of Feb 2025, to have his lipids rechecked.  Advised him to continue his current regimen of ASA and Plavix, per Dr. Rosemary Holms.   Pt stated that he does not have a Neurologist, for they never referred him to one because he had a negative stroke work-up in the hospital.  Informed the pt that I will make Dr. Rosemary Holms aware of this.   Confirmed the pharmacy of choice with the pt.   Repeat lipids in 3 months placed and released in the system.  Pt is aware to come fasting to this lab appt.   Pt verbalized understanding and agrees with this plan.

## 2023-02-24 NOTE — Telephone Encounter (Signed)
Oh I see. I must have read the initial notes that had talked about neurology outpatient follow up. Please disregard.    Thanks  MJP    Pt aware of the above message per Dr. Rosemary Holms.  He was very appreciative for all the assistance provided.

## 2023-02-24 NOTE — Telephone Encounter (Signed)
Oh I see. I must have read the initial notes that had talked about neurology outpatient follow up. Please disregard.  Thanks MJP

## 2023-02-24 NOTE — Progress Notes (Signed)
Severe calcification but no significant blockages in the heart arteries.  Continue aggressive medical management.  I would aim to bring her LDL down below 55.  Therefore, recommend increasing Crestor to 40 mg daily.  Also noticed your recent ER visit.  Continue aspirin and Plavix and follow-up with neurology.  Thanks MJP

## 2023-02-24 NOTE — Progress Notes (Signed)
Patient ID: Larry Holloway, male   DOB: 03/28/1951, 72 y.o.   MRN: [id number]  Reason for Consult: New Patient (Initial Visit)   Referred by Daisy Floro, MD  Subjective:     HPI:  Larry Holloway is a 72 y.o. male without significant history of vascular disease recently was diagnosed with carotid stenosis after ER trip following blurry vision.  He is subsequently been placed on aspirin, Plavix and a statin.  He denies any history of stroke, TIA or amaurosis.  He is out of his vision changes he states he had cloudy vision which lasted for many days and is now resolving but still has some visual defects.  He did not have any symptoms similar to a shade coming down over his eye.  Past Medical History:  Diagnosis Date   Arthritis    Back   Back pain    Cancer of prostate (HCC)    Carotid artery occlusion    Central serous retinopathy    High blood pressure    High cholesterol    Mild depression    Neck pain    PONV (postoperative nausea and vomiting)    Pre-diabetes    Right arm pain    Family History  Problem Relation Age of Onset   Thyroid disease Mother    Heart disease Mother    Cancer Mother    High blood pressure Mother    Colon polyps Mother    Cancer - Lung Mother    Colon polyps Father    CAD Father    Cancer - Prostate Father    High blood pressure Father    Heart disease Father    COPD Father    CVA Father    Cancer - Prostate Brother    Past Surgical History:  Procedure Laterality Date   ANTERIOR LAT LUMBAR FUSION N/A 08/10/2022   Procedure: Lumbar One-Two, Lumbar Two-Three, Lumbar Three-Four Anterior Lateral Lumbar Fusion;  Surgeon: Barnett Abu, MD;  Location: [facility] OR;  Service: Neurosurgery;  Laterality: N/A;   CATARACT EXTRACTION W/ INTRAOCULAR LENS IMPLANT Bilateral 2022   COLONOSCOPY  2023   FRACTURE SURGERY Right 1975   Right Leg   HIP SURGERY Right 2002   Ball and Socket repaired   NECK SURGERY  2014   Cadaver disc placed in neck    PROSTATE SURGERY     2007    Short Social History:  Social History   Tobacco Use   Smoking status: Former    Current packs/day: 0.00    Types: Cigarettes    Quit date: 1995    Years since quitting: 29.9   Smokeless tobacco: Never   Tobacco comments:    July 1995  Substance Use Topics   Alcohol use: No    Comment: quit Oct.1996    Allergies  Allergen Reactions   Tape Swelling   Wound Dressing Adhesive Swelling    Current Outpatient Medications  Medication Sig Dispense Refill   acetaminophen (TYLENOL) 650 MG CR tablet 2 tablets as needed Orally once a day     Acetaminophen 500 MG capsule 2 capsules as needed Orally once a day     ALPRAZolam (XANAX) 0.25 MG tablet Take 0.25-0.5 mg by mouth 2 (two) times daily as needed (Restless leg).     amLODipine (NORVASC) 5 MG tablet Take 7.5 mg by mouth daily.     Ascorbic Acid (VITAMIN C) 1000 MG tablet Take 1,000 mg by mouth daily.  aspirin EC 81 MG tablet Take 1 tablet (81 mg total) by mouth daily. Swallow whole. 30 tablet 0   cholecalciferol (VITAMIN D3) 25 MCG (1000 UNIT) tablet Take 1,000 Units by mouth daily.     clopidogrel (PLAVIX) 75 MG tablet Take 1 tablet (75 mg total) by mouth daily. 30 tablet 0   DULoxetine (CYMBALTA) 60 MG capsule Take 60 mg by mouth daily.     ferrous sulfate 325 (65 FE) MG tablet Take 325 mg by mouth daily with breakfast.     L-Methylfolate 15 MG TABS Take 7.5 mg by mouth daily.     loratadine (CLARITIN) 10 MG tablet Take 10 mg by mouth daily.     Multiple Vitamins-Minerals (MULTIVITAMIN PO) Take 1 tablet by mouth daily. One a day     omeprazole (PRILOSEC) 20 MG capsule Take 20 mg by mouth daily.     pramipexole (MIRAPEX) 0.5 MG tablet Take 0.5 mg by mouth 2 (two) times daily.     rosuvastatin (CRESTOR) 40 MG tablet Take 1 tablet (40 mg total) by mouth daily. 90 tablet 2   fluticasone (FLONASE) 50 MCG/ACT nasal spray 50 sprays.     oxyCODONE-acetaminophen (PERCOCET/ROXICET) 5-325 MG tablet Take 1  tablet by mouth every 4 (four) hours as needed for moderate pain or severe pain. 60 tablet 0   No current facility-administered medications for this visit.    Review of Systems  Constitutional:  Constitutional negative. HENT: HENT negative.  Eyes: Positive for visual disturbance.   Respiratory: Respiratory negative.  Cardiovascular: Cardiovascular negative.  GI: Gastrointestinal negative.  Musculoskeletal: Musculoskeletal negative.  Skin: Skin negative.  Neurological: Neurological negative. Hematologic: Hematologic/lymphatic negative.  Psychiatric: Psychiatric negative.        Objective:  Objective   Vitals:   02/24/23 1101  BP: (!) 152/89  Pulse: 77  Temp: 98.2 F (36.8 C)  TempSrc: Temporal  SpO2: 98%  Weight: 192 lb 6.4 oz (87.3 kg)  Height: 5\' 10"  (1.778 m)   Body mass index is 27.61 kg/m.  Physical Exam Constitutional:      Appearance: Normal appearance.  HENT:     Head: Normocephalic.  Eyes:     Pupils: Pupils are equal, round, and reactive to light.  Neck:     Vascular: No carotid bruit.  Cardiovascular:     Rate and Rhythm: Normal rate.     Pulses:          Popliteal pulses are 2+ on the right side and 2+ on the left side.       Dorsalis pedis pulses are 3+ on the right side and 3+ on the left side.       Posterior tibial pulses are 3+ on the right side and 3+ on the left side.  Pulmonary:     Effort: Pulmonary effort is normal.  Abdominal:     General: Abdomen is flat.     Palpations: Abdomen is soft.  Musculoskeletal:        General: Normal range of motion.     Right lower leg: No edema.     Left lower leg: No edema.  Skin:    General: Skin is warm.     Capillary Refill: Capillary refill takes less than 2 seconds.  Neurological:     General: No focal deficit present.     Mental Status: He is alert.  Psychiatric:        Mood and Affect: Mood normal.        Thought Content: Thought  content normal.        Judgment: Judgment normal.      Data:  MRA NECK WITHOUT AND WITH CONTRAST   TECHNIQUE: Multiplanar, multi-echo pulse sequences of the brain and surrounding structures were acquired without intravenous contrast. Angiographic images of the Circle of Willis were acquired using MRA technique without intravenous contrast. Angiographic images of the neck were acquired using MRA technique without and with intravenous contrast. Carotid stenosis measurements (when applicable) are obtained utilizing NASCET criteria, using the distal internal carotid diameter as the denominator.   CONTRAST:  8mL GADAVIST GADOBUTROL 1 MMOL/ML IV SOLN   COMPARISON:  02/16/2020   FINDINGS: MRI HEAD FINDINGS   Brain: No acute infarct, mass effect or extra-axial collection. No chronic microhemorrhage or siderosis. There is multifocal hyperintense T2-weighted signal within the white matter. Parenchymal volume and CSF spaces are normal. The midline structures are normal.   Vascular: Normal flow voids   Skull and upper cervical spine: Normal   Sinuses/Orbits: Paranasal sinuses are clear. No mastoid effusion. Normal orbits. Ocular lens replacements.   Other: None   MRA HEAD FINDINGS   POSTERIOR CIRCULATION:   --Vertebral arteries: Normal   --Inferior cerebellar arteries: Normal.   --Basilar artery: Normal.   --Superior cerebellar arteries: Normal.   --Posterior cerebral arteries: Normal.   ANTERIOR CIRCULATION:   --Intracranial internal carotid arteries: Normal.   --Anterior cerebral arteries (ACA): Normal.   --Middle cerebral arteries (MCA): Normal.   Anatomic variants: None   MRA NECK FINDINGS   Postcontrast imaging degraded by poor bolus timing   Aortic arch: Normal 3 vessel branching pattern. Visualized subclavian arteries are normal.   Right carotid system: No dissection, occlusion or stenosis.   Left carotid system: Approximately 70% stenosis at the distal common carotid artery, just proximal to the  bifurcation. The ICA is normal.   Vertebral arteries: Codominant system.  Normal.   Other: None   IMPRESSION: 1. No acute intracranial abnormality. 2. Normal intracranial MRA. 3. Approximately 70% stenosis of the distal left common carotid artery, just proximal to the bifurcation.       Assessment/Plan:    72 year old male with recent visual field deficits on the left worked up with MRI and found to have 70% stenosis of the distal left common carotid artery.  We reviewed his MRI today we also discussed the limitations of MRI to evaluate carotid disease and he demonstrates good understanding of this.  As such the plan will be to obtain formal carotid duplex and have him follow-up in 3 to 4 weeks.      Maeola Harman MD [facility]

## 2023-02-24 NOTE — Progress Notes (Signed)
Repeat lipid panel in 3 months.  Thanks MJP

## 2023-02-25 ENCOUNTER — Encounter: Payer: Self-pay | Admitting: Cardiology

## 2023-03-02 ENCOUNTER — Other Ambulatory Visit: Payer: Self-pay

## 2023-03-02 DIAGNOSIS — I6522 Occlusion and stenosis of left carotid artery: Secondary | ICD-10-CM

## 2023-03-03 ENCOUNTER — Inpatient Hospital Stay (HOSPITAL_COMMUNITY): Admission: RE | Admit: 2023-03-03 | Payer: Medicare Other | Source: Ambulatory Visit

## 2023-03-03 ENCOUNTER — Other Ambulatory Visit (HOSPITAL_COMMUNITY): Payer: Medicare Other

## 2023-03-12 ENCOUNTER — Encounter: Payer: Medicare Other | Admitting: Vascular Surgery

## 2023-03-16 ENCOUNTER — Encounter: Payer: Medicare Other | Admitting: Vascular Surgery

## 2023-03-27 ENCOUNTER — Encounter (HOSPITAL_COMMUNITY): Payer: Self-pay

## 2023-03-27 ENCOUNTER — Observation Stay (HOSPITAL_COMMUNITY)
Admission: EM | Admit: 2023-03-27 | Discharge: 2023-03-29 | Disposition: A | Discharge status: Home / Self Care | Payer: Medicare Other | Attending: General Surgery | Admitting: General Surgery

## 2023-03-27 ENCOUNTER — Other Ambulatory Visit: Payer: Self-pay

## 2023-03-27 DIAGNOSIS — J439 Emphysema, unspecified: Secondary | ICD-10-CM | POA: Diagnosis not present

## 2023-03-27 DIAGNOSIS — Z87891 Personal history of nicotine dependence: Secondary | ICD-10-CM | POA: Insufficient documentation

## 2023-03-27 DIAGNOSIS — M19011 Primary osteoarthritis, right shoulder: Secondary | ICD-10-CM | POA: Diagnosis not present

## 2023-03-27 DIAGNOSIS — S199XXA Unspecified injury of neck, initial encounter: Secondary | ICD-10-CM | POA: Diagnosis not present

## 2023-03-27 DIAGNOSIS — M1711 Unilateral primary osteoarthritis, right knee: Secondary | ICD-10-CM | POA: Diagnosis not present

## 2023-03-27 DIAGNOSIS — W108XXA Fall (on) (from) other stairs and steps, initial encounter: Secondary | ICD-10-CM | POA: Diagnosis not present

## 2023-03-27 DIAGNOSIS — S42011A Anterior displaced fracture of sternal end of right clavicle, initial encounter for closed fracture: Secondary | ICD-10-CM | POA: Diagnosis not present

## 2023-03-27 DIAGNOSIS — Z8546 Personal history of malignant neoplasm of prostate: Secondary | ICD-10-CM | POA: Diagnosis not present

## 2023-03-27 DIAGNOSIS — S1093XA Contusion of unspecified part of neck, initial encounter: Secondary | ICD-10-CM | POA: Diagnosis present

## 2023-03-27 DIAGNOSIS — Z79899 Other long term (current) drug therapy: Secondary | ICD-10-CM | POA: Insufficient documentation

## 2023-03-27 DIAGNOSIS — Z981 Arthrodesis status: Secondary | ICD-10-CM | POA: Diagnosis not present

## 2023-03-27 DIAGNOSIS — Z8673 Personal history of transient ischemic attack (TIA), and cerebral infarction without residual deficits: Secondary | ICD-10-CM | POA: Diagnosis not present

## 2023-03-27 DIAGNOSIS — S3993XA Unspecified injury of pelvis, initial encounter: Secondary | ICD-10-CM | POA: Diagnosis not present

## 2023-03-27 DIAGNOSIS — S42001A Fracture of unspecified part of right clavicle, initial encounter for closed fracture: Principal | ICD-10-CM | POA: Insufficient documentation

## 2023-03-27 DIAGNOSIS — I700 Atherosclerosis of aorta: Secondary | ICD-10-CM | POA: Diagnosis not present

## 2023-03-27 DIAGNOSIS — M25561 Pain in right knee: Secondary | ICD-10-CM | POA: Diagnosis not present

## 2023-03-27 DIAGNOSIS — Z7982 Long term (current) use of aspirin: Secondary | ICD-10-CM | POA: Insufficient documentation

## 2023-03-27 DIAGNOSIS — Z7902 Long term (current) use of antithrombotics/antiplatelets: Secondary | ICD-10-CM | POA: Insufficient documentation

## 2023-03-27 DIAGNOSIS — S3991XA Unspecified injury of abdomen, initial encounter: Secondary | ICD-10-CM | POA: Diagnosis not present

## 2023-03-27 DIAGNOSIS — S0990XA Unspecified injury of head, initial encounter: Secondary | ICD-10-CM | POA: Diagnosis not present

## 2023-03-27 DIAGNOSIS — R918 Other nonspecific abnormal finding of lung field: Secondary | ICD-10-CM | POA: Diagnosis not present

## 2023-03-27 DIAGNOSIS — S20211A Contusion of right front wall of thorax, initial encounter: Secondary | ICD-10-CM | POA: Insufficient documentation

## 2023-03-27 DIAGNOSIS — S299XXA Unspecified injury of thorax, initial encounter: Secondary | ICD-10-CM | POA: Diagnosis not present

## 2023-03-27 DIAGNOSIS — R0989 Other specified symptoms and signs involving the circulatory and respiratory systems: Secondary | ICD-10-CM | POA: Diagnosis not present

## 2023-03-27 DIAGNOSIS — R9431 Abnormal electrocardiogram [ECG] [EKG]: Secondary | ICD-10-CM | POA: Diagnosis not present

## 2023-03-27 DIAGNOSIS — Z96642 Presence of left artificial hip joint: Secondary | ICD-10-CM | POA: Diagnosis not present

## 2023-03-27 DIAGNOSIS — Z043 Encounter for examination and observation following other accident: Secondary | ICD-10-CM | POA: Diagnosis not present

## 2023-03-27 NOTE — ED Triage Notes (Addendum)
Pt reports he fell tonight when walking down the steps, approx 12-13 steps. + head injury. He denies LOC but he does take Plavix. Last dose this morning. He has abrasions to the top of his head. He is reporting right shoulder pain, visible swelling and deformity. Swelling and abrasions noted to right knee as well.

## 2023-03-27 NOTE — ED Notes (Signed)
LEVEL 2 activated on pt per triage nurse Melanie O req. 72 yo M FOT with head injury

## 2023-03-28 ENCOUNTER — Observation Stay (HOSPITAL_COMMUNITY): Payer: Medicare Other

## 2023-03-28 ENCOUNTER — Emergency Department (HOSPITAL_COMMUNITY): Payer: Medicare Other

## 2023-03-28 ENCOUNTER — Encounter: Payer: Self-pay | Admitting: Vascular Surgery

## 2023-03-28 DIAGNOSIS — M1711 Unilateral primary osteoarthritis, right knee: Secondary | ICD-10-CM | POA: Diagnosis not present

## 2023-03-28 DIAGNOSIS — Z043 Encounter for examination and observation following other accident: Secondary | ICD-10-CM | POA: Diagnosis not present

## 2023-03-28 DIAGNOSIS — S3991XA Unspecified injury of abdomen, initial encounter: Secondary | ICD-10-CM | POA: Diagnosis not present

## 2023-03-28 DIAGNOSIS — R0989 Other specified symptoms and signs involving the circulatory and respiratory systems: Secondary | ICD-10-CM | POA: Diagnosis not present

## 2023-03-28 DIAGNOSIS — S42011A Anterior displaced fracture of sternal end of right clavicle, initial encounter for closed fracture: Secondary | ICD-10-CM | POA: Diagnosis not present

## 2023-03-28 DIAGNOSIS — S20211A Contusion of right front wall of thorax, initial encounter: Secondary | ICD-10-CM | POA: Diagnosis not present

## 2023-03-28 DIAGNOSIS — Z96642 Presence of left artificial hip joint: Secondary | ICD-10-CM | POA: Diagnosis not present

## 2023-03-28 DIAGNOSIS — M19011 Primary osteoarthritis, right shoulder: Secondary | ICD-10-CM | POA: Diagnosis not present

## 2023-03-28 DIAGNOSIS — S42024A Nondisplaced fracture of shaft of right clavicle, initial encounter for closed fracture: Secondary | ICD-10-CM | POA: Diagnosis not present

## 2023-03-28 DIAGNOSIS — I6503 Occlusion and stenosis of bilateral vertebral arteries: Secondary | ICD-10-CM | POA: Diagnosis not present

## 2023-03-28 DIAGNOSIS — S299XXA Unspecified injury of thorax, initial encounter: Secondary | ICD-10-CM | POA: Diagnosis not present

## 2023-03-28 DIAGNOSIS — J439 Emphysema, unspecified: Secondary | ICD-10-CM | POA: Diagnosis not present

## 2023-03-28 DIAGNOSIS — I700 Atherosclerosis of aorta: Secondary | ICD-10-CM | POA: Diagnosis not present

## 2023-03-28 DIAGNOSIS — Z981 Arthrodesis status: Secondary | ICD-10-CM | POA: Diagnosis not present

## 2023-03-28 DIAGNOSIS — S3993XA Unspecified injury of pelvis, initial encounter: Secondary | ICD-10-CM | POA: Diagnosis not present

## 2023-03-28 DIAGNOSIS — S1093XA Contusion of unspecified part of neck, initial encounter: Secondary | ICD-10-CM | POA: Diagnosis present

## 2023-03-28 DIAGNOSIS — S199XXA Unspecified injury of neck, initial encounter: Secondary | ICD-10-CM | POA: Diagnosis not present

## 2023-03-28 DIAGNOSIS — I6523 Occlusion and stenosis of bilateral carotid arteries: Secondary | ICD-10-CM | POA: Diagnosis not present

## 2023-03-28 DIAGNOSIS — R918 Other nonspecific abnormal finding of lung field: Secondary | ICD-10-CM | POA: Diagnosis not present

## 2023-03-28 DIAGNOSIS — S0990XA Unspecified injury of head, initial encounter: Secondary | ICD-10-CM | POA: Diagnosis not present

## 2023-03-28 LAB — COMPREHENSIVE METABOLIC PANEL
ALT: 26 U/L (ref 0–44)
AST: 28 U/L (ref 15–41)
Albumin: 3.7 g/dL (ref 3.5–5.0)
Alkaline Phosphatase: 67 U/L (ref 38–126)
Anion gap: 10 (ref 5–15)
BUN: 15 mg/dL (ref 8–23)
CO2: 24 mmol/L (ref 22–32)
Calcium: 8.9 mg/dL (ref 8.9–10.3)
Chloride: 102 mmol/L (ref 98–111)
Creatinine, Ser: 0.99 mg/dL (ref 0.61–1.24)
GFR, Estimated: >60 mL/min (ref >60)
Glucose, Bld: 163 mg/dL — ABNORMAL HIGH (ref 70–99)
Potassium: 4.2 mmol/L (ref 3.5–5.1)
Sodium: 136 mmol/L (ref 135–145)
Total Bilirubin: 0.4 mg/dL (ref <1.2)
Total Protein: 6.4 g/dL — ABNORMAL LOW (ref 6.5–8.1)

## 2023-03-28 LAB — ETHANOL: Alcohol, Ethyl (B): <10 mg/dL (ref <10)

## 2023-03-28 LAB — URINALYSIS, ROUTINE W REFLEX MICROSCOPIC
Bilirubin Urine: NEGATIVE
Glucose, UA: NEGATIVE mg/dL
Hgb urine dipstick: NEGATIVE
Ketones, ur: NEGATIVE mg/dL
Leukocytes,Ua: NEGATIVE
Nitrite: NEGATIVE
Protein, ur: NEGATIVE mg/dL
Specific Gravity, Urine: >1.046 — ABNORMAL HIGH (ref 1.005–1.030)
pH: 7 (ref 5.0–8.0)

## 2023-03-28 LAB — I-STAT CHEM 8, ED
BUN: 16 mg/dL (ref 8–23)
Calcium, Ion: 1.15 mmol/L (ref 1.15–1.40)
Chloride: 100 mmol/L (ref 98–111)
Creatinine, Ser: 1 mg/dL (ref 0.61–1.24)
Glucose, Bld: 175 mg/dL — ABNORMAL HIGH (ref 70–99)
HCT: 37 % — ABNORMAL LOW (ref 39.0–52.0)
Hemoglobin: 12.6 g/dL — ABNORMAL LOW (ref 13.0–17.0)
Potassium: 4 mmol/L (ref 3.5–5.1)
Sodium: 138 mmol/L (ref 135–145)
TCO2: 26 mmol/L (ref 22–32)

## 2023-03-28 LAB — CBC
HCT: 37.4 % — ABNORMAL LOW (ref 39.0–52.0)
Hemoglobin: 12.4 g/dL — ABNORMAL LOW (ref 13.0–17.0)
MCH: 30 pg (ref 26.0–34.0)
MCHC: 33.2 g/dL (ref 30.0–36.0)
MCV: 90.6 fL (ref 80.0–100.0)
Platelets: 215 10*3/uL (ref 150–400)
RBC: 4.13 MIL/uL — ABNORMAL LOW (ref 4.22–5.81)
RDW: 15.3 % (ref 11.5–15.5)
WBC: 7.2 10*3/uL (ref 4.0–10.5)
nRBC: 0 % (ref 0.0–0.2)

## 2023-03-28 LAB — PROTIME-INR
INR: 1 (ref 0.8–1.2)
Prothrombin Time: 13.3 s (ref 11.4–15.2)

## 2023-03-28 LAB — SAMPLE TO BLOOD BANK

## 2023-03-28 LAB — I-STAT CG4 LACTIC ACID, ED: Lactic Acid, Venous: 1.2 mmol/L (ref 0.5–1.9)

## 2023-03-28 MED ORDER — POLYETHYLENE GLYCOL 3350 17 G PO PACK: 17.0000 g | PACK | Freq: Every day | ORAL | Status: DC | PRN

## 2023-03-28 MED ORDER — ONDANSETRON 4 MG PO TBDP: 4.0000 mg | ORAL_TABLET | Freq: Four times a day (QID) | ORAL | Status: DC | PRN

## 2023-03-28 MED ORDER — IOHEXOL 350 MG/ML SOLN
75.0000 mL | Freq: Once | INTRAVENOUS | Status: AC | PRN
  Administered 2023-03-28: 75 mL via INTRAVENOUS

## 2023-03-28 MED ORDER — METOPROLOL TARTRATE 5 MG/5ML IV SOLN: 5.0000 mg | Freq: Four times a day (QID) | INTRAVENOUS | Status: DC | PRN

## 2023-03-28 MED ORDER — DOCUSATE SODIUM 100 MG PO CAPS
100.0000 mg | ORAL_CAPSULE | Freq: Two times a day (BID) | ORAL | Status: DC
  Administered 2023-03-28 – 2023-03-29 (×3): 100 mg via ORAL
  Filled 2023-03-28 (×3): qty 1

## 2023-03-28 MED ORDER — HYDRALAZINE HCL 20 MG/ML IJ SOLN: 10.0000 mg | INTRAMUSCULAR | Status: DC | PRN

## 2023-03-28 MED ORDER — MORPHINE SULFATE (PF) 4 MG/ML IV SOLN
4.0000 mg | Freq: Once | INTRAVENOUS | Status: AC
  Administered 2023-03-28: 4 mg via INTRAVENOUS
  Filled 2023-03-28: qty 1

## 2023-03-28 MED ORDER — METHOCARBAMOL 1000 MG/10ML IJ SOLN
500.0000 mg | Freq: Three times a day (TID) | INTRAMUSCULAR | Status: DC
  Filled 2023-03-28 (×2): qty 10

## 2023-03-28 MED ORDER — METHOCARBAMOL 500 MG PO TABS
500.0000 mg | ORAL_TABLET | Freq: Three times a day (TID) | ORAL | Status: DC
  Administered 2023-03-28 – 2023-03-29 (×5): 500 mg via ORAL
  Filled 2023-03-28 (×5): qty 1

## 2023-03-28 MED ORDER — ONDANSETRON HCL 4 MG/2ML IJ SOLN: 4.0000 mg | Freq: Four times a day (QID) | INTRAMUSCULAR | Status: DC | PRN

## 2023-03-28 MED ORDER — ACETAMINOPHEN 500 MG PO TABS
1000.0000 mg | ORAL_TABLET | Freq: Four times a day (QID) | ORAL | Status: DC
  Administered 2023-03-28 – 2023-03-29 (×5): 1000 mg via ORAL
  Filled 2023-03-28 (×7): qty 2

## 2023-03-28 MED ORDER — OXYCODONE HCL 5 MG PO TABS
2.5000 mg | ORAL_TABLET | ORAL | Status: DC | PRN
  Administered 2023-03-28 – 2023-03-29 (×2): 5 mg via ORAL
  Filled 2023-03-28 (×2): qty 1

## 2023-03-28 NOTE — ED Provider Notes (Signed)
[facility]EMERGENCY DEPT [facility] Emergency Department Provider Note MRN:  [id number]  Arrival date & time: 03/28/23     Chief Complaint   Fall   History of Present Illness   Larry Holloway is a 72 y.o. year-old male with a history of carotid artery disease presenting to the ED with chief complaint of fall.  Presenting as a level 2 trauma, forgot what room he was in the house after getting up to the bathroom, fell down 13 stairs.  Pain to the right knee, right clavicle, hit his head, takes Plavix.  Review of Systems  A thorough review of systems was obtained and all systems are negative except as noted in the HPI and PMH.   Patient's Health History    Past Medical History:  Diagnosis Date   Arthritis    Back   Back pain    Cancer of prostate (HCC)    Carotid artery occlusion    Central serous retinopathy    High blood pressure    High cholesterol    Mild depression    Neck pain    PONV (postoperative nausea and vomiting)    Pre-diabetes    Right arm pain     Past Surgical History:  Procedure Laterality Date   ANTERIOR LAT LUMBAR FUSION N/A 08/10/2022   Procedure: Lumbar One-Two, Lumbar Two-Three, Lumbar Three-Four Anterior Lateral Lumbar Fusion;  Surgeon: Barnett Abu, MD;  Location: [facility] OR;  Service: Neurosurgery;  Laterality: N/A;   CATARACT EXTRACTION W/ INTRAOCULAR LENS IMPLANT Bilateral 2022   COLONOSCOPY  2023   FRACTURE SURGERY Right 1975   Right Leg   HIP SURGERY Right 2002   Ball and Socket repaired   NECK SURGERY  2014   Cadaver disc placed in neck   PROSTATE SURGERY     2007    Family History  Problem Relation Age of Onset   Thyroid disease Mother    Heart disease Mother    Cancer Mother    High blood pressure Mother    Colon polyps Mother    Cancer - Lung Mother    Colon polyps Father    CAD Father    Cancer - Prostate Father    High blood pressure Father    Heart disease Father    COPD Father    CVA Father    Cancer - Prostate Brother      Social History   Socioeconomic History   Marital status: Married    Spouse name: Not on file   Number of children: Not on file   Years of education: Not on file   Highest education level: Not on file  Occupational History   Not on file  Tobacco Use   Smoking status: Former    Current packs/day: 0.00    Types: Cigarettes    Quit date: 1995    Years since quitting: 30.0   Smokeless tobacco: Never   Tobacco comments:    July 1995  Vaping Use   Vaping status: Never Used  Substance and Sexual Activity   Alcohol use: No    Comment: quit Oct.1996   Drug use: No   Sexual activity: Yes  Other Topics Concern   Not on file  Social History Narrative   Patient lives at home with his wife Rosanna Randy . Patient works for himself and work in Airline pilot.  Patient has two children.   Social Drivers of Corporate investment banker Strain: Not on file  Food Insecurity: No Food  Insecurity (08/14/2022)   Hunger Vital Sign    Worried About Running Out of Food in the Last Year: Never true    Ran Out of Food in the Last Year: Never true  Transportation Needs: No Transportation Needs (08/14/2022)   PRAPARE - Administrator, Civil Service (Medical): No    Lack of Transportation (Non-Medical): No  Physical Activity: Not on file  Stress: Not on file  Social Connections: Not on file  Intimate Partner Violence: Not At Risk (08/14/2022)   Humiliation, Afraid, Rape, and Kick questionnaire    Fear of Current or Ex-Partner: No    Emotionally Abused: No    Physically Abused: No    Sexually Abused: No     Physical Exam   Vitals:   03/28/23 0330 03/28/23 0340  BP: 109/66   Pulse: 67   Resp: 15   Temp:  98.1 F (36.7 C)  SpO2: 99%     CONSTITUTIONAL: Well-appearing, NAD NEURO/PSYCH:  Alert and oriented x 3, no focal deficits EYES:  eyes equal and reactive ENT/NECK:  no LAD, no JVD CARDIO: Regular rate, well-perfused, normal S1 and S2 PULM:  CTAB no wheezing or rhonchi GI/GU:   non-distended, non-tender MSK/SPINE: Large hematoma to the right clavicle, right medial knee, normal range of motion SKIN:  no rash, atraumatic   *Additional and/or pertinent findings included in MDM below  Diagnostic and Interventional Summary    EKG Interpretation Date/Time:  Sunday March 28 2023 02:52:17 EST Ventricular Rate:  76 PR Interval:  200 QRS Duration:  119 QT Interval:  407 QTC Calculation: 458 R Axis:   53  Text Interpretation: Sinus rhythm Incomplete right bundle branch block Confirmed by Kennis Carina [phone]) on 03/28/2023 3:53:28 AM       Labs Reviewed  COMPREHENSIVE METABOLIC PANEL - Abnormal; Notable for the following components:      Result Value   Glucose, Bld 163 (*)    Total Protein 6.4 (*)    All other components within normal limits  CBC - Abnormal; Notable for the following components:   RBC 4.13 (*)    Hemoglobin 12.4 (*)    HCT 37.4 (*)    All other components within normal limits  I-STAT CHEM 8, ED - Abnormal; Notable for the following components:   Glucose, Bld 175 (*)    Hemoglobin 12.6 (*)    HCT 37.0 (*)    All other components within normal limits  ETHANOL  PROTIME-INR  URINALYSIS, ROUTINE W REFLEX MICROSCOPIC  I-STAT CG4 LACTIC ACID, ED  SAMPLE TO BLOOD BANK    CT HEAD WO CONTRAST ( )  Final Result    CT CERVICAL SPINE WO CONTRAST  Final Result    CT CHEST ABDOMEN PELVIS W CONTRAST  Final Result    DG Chest Port 1 View  Final Result    DG Pelvis Portable  Final Result    DG Knee Complete 4 Views Right  Final Result    DG Clavicle Right  Final Result      Medications  acetaminophen (TYLENOL) tablet 1,000 mg (has no administration in time range)  oxyCODONE (Oxy IR/ROXICODONE) immediate release tablet 2.5-5 mg (has no administration in time range)  methocarbamol (ROBAXIN) tablet 500 mg (has no administration in time range)    Or  methocarbamol (ROBAXIN) injection 500 mg (has no administration in time range)   docusate sodium (COLACE) capsule 100 mg (has no administration in time range)  polyethylene glycol (MIRALAX / GLYCOLAX) packet 17  g (has no administration in time range)  ondansetron (ZOFRAN-ODT) disintegrating tablet 4 mg (has no administration in time range)    Or  ondansetron (ZOFRAN) injection 4 mg (has no administration in time range)  metoprolol tartrate (LOPRESSOR) injection 5 mg (has no administration in time range)  hydrALAZINE (APRESOLINE) injection 10 mg (has no administration in time range)  morphine (PF) 4 MG/ML injection 4 mg (4 mg Intravenous Given 03/28/23 0026)  iohexol (OMNIPAQUE) 350 MG/ML injection 75 mL (75 mLs Intravenous Contrast Given 03/28/23 0116)     Procedures  /  Critical Care .Critical Care  Performed by: Sabas Sous, MD Authorized by: Sabas Sous, MD   Critical care provider statement:    Critical care time (minutes):  35   Critical care was necessary to treat or prevent imminent or life-threatening deterioration of the following conditions:  Trauma   Critical care was time spent personally by me on the following activities:  Development of treatment plan with patient or surrogate, discussions with consultants, evaluation of patient's response to treatment, examination of patient, ordering and review of laboratory studies, ordering and review of radiographic studies, ordering and performing treatments and interventions, pulse oximetry, re-evaluation of patient's condition and review of old charts   ED Course and Medical Decision Making  Initial Impression and Ddx Patient has mild evidence of head trauma, anticoagulated, level 2 trauma alert.  Has a large hematoma forming above his right clavicle, suspect fracture.  Hematoma to the right knee as well.  Will obtain advanced imaging.  Past medical/surgical history that increases complexity of ED encounter: Carotid artery disease on Plavix  Interpretation of Diagnostics I personally reviewed the EKG  and my interpretation is as follows: Sinus rhythm  No significant blood count or electrolyte disturbance  Patient Reassessment and Ultimate Disposition/Management     CT imaging most notable for clavicle fracture with surrounding hematoma extending into the sternocleidomastoid, pectoralis.  CT findings discussed with Dr. Bonita Quin of trauma surgery.  Plan is for observation admission to ensure no worsening of hematoma that would compromise the airway.  Patient agreeable with plan.  Patient management required discussion with the following services or consulting groups:  General/Trauma Surgery  Complexity of Problems Addressed Acute illness or injury that poses threat of life of bodily function  Additional Data Reviewed and Analyzed Further history obtained from: EMS on arrival  Additional Factors Impacting ED Encounter Risk Use of parenteral controlled substances and Consideration of hospitalization  Elmer Sow. Pilar Plate, MD [facility] [email or web address]  Final Clinical Impressions(s) / ED Diagnoses     ICD-10-CM   1. Closed displaced fracture of right clavicle, unspecified part of clavicle, initial encounter  S42.001A     2. Hematoma of right chest wall, initial encounter  Z61.096E       ED Discharge Orders     None        Discharge Instructions Discussed with and Provided to Patient:   Discharge Instructions   None      Sabas Sous, MD 03/28/23 [phone]

## 2023-03-28 NOTE — Consult Note (Signed)
ORTHOPAEDIC CONSULTATION  REQUESTING PHYSICIAN: Md, Trauma, MD  Chief Complaint: right clavicle fracture with hematoma  HPI: Larry Holloway is a 72 year old male s/p mechanical fall down stairs. He was found to have a right clavicle fracture with associated hematoma. H/o stroke in 01/2023. Patient reports taking plavix, ASA325, and ASA81 at home.    Past Medical History:  Diagnosis Date   Arthritis    Back   Back pain    Cancer of prostate (HCC)    Carotid artery occlusion    Central serous retinopathy    High blood pressure    High cholesterol    Mild depression    Neck pain    PONV (postoperative nausea and vomiting)    Pre-diabetes    Right arm pain    Past Surgical History:  Procedure Laterality Date   ANTERIOR LAT LUMBAR FUSION N/A 08/10/2022   Procedure: Lumbar One-Two, Lumbar Two-Three, Lumbar Three-Four Anterior Lateral Lumbar Fusion;  Surgeon: Barnett Abu, MD;  Location: [facility] OR;  Service: Neurosurgery;  Laterality: N/A;   CATARACT EXTRACTION W/ INTRAOCULAR LENS IMPLANT Bilateral 2022   COLONOSCOPY  2023   FRACTURE SURGERY Right 1975   Right Leg   HIP SURGERY Right 2002   Ball and Socket repaired   NECK SURGERY  2014   Cadaver disc placed in neck   PROSTATE SURGERY     2007   Social History   Socioeconomic History   Marital status: Married    Spouse name: Not on file   Number of children: Not on file   Years of education: Not on file   Highest education level: Not on file  Occupational History   Not on file  Tobacco Use   Smoking status: Former    Current packs/day: 0.00    Types: Cigarettes    Quit date: 1995    Years since quitting: 30.0   Smokeless tobacco: Never   Tobacco comments:    July 1995  Vaping Use   Vaping status: Never Used  Substance and Sexual Activity   Alcohol use: No    Comment: quit Oct.1996   Drug use: No   Sexual activity: Yes  Other Topics Concern   Not on file  Social History Narrative   Patient lives at home  with his wife Rosanna Randy . Patient works for himself and work in Airline pilot.  Patient has two children.   Social Drivers of Corporate investment banker Strain: Not on file  Food Insecurity: No Food Insecurity (08/14/2022)   Hunger Vital Sign    Worried About Running Out of Food in the Last Year: Never true    Ran Out of Food in the Last Year: Never true  Transportation Needs: No Transportation Needs (08/14/2022)   PRAPARE - Administrator, Civil Service (Medical): No    Lack of Transportation (Non-Medical): No  Physical Activity: Not on file  Stress: Not on file  Social Connections: Not on file   Family History  Problem Relation Age of Onset   Thyroid disease Mother    Heart disease Mother    Cancer Mother    High blood pressure Mother    Colon polyps Mother    Cancer - Lung Mother    Colon polyps Father    CAD Father    Cancer - Prostate Father    High blood pressure Father    Heart disease Father    COPD Father    CVA Father  Cancer - Prostate Brother    Allergies  Allergen Reactions   Ace Inhibitors Cough   Corticosteroids     Other Reaction(s): Central seroud retinopathy   Tape Swelling   Wound Dressing Adhesive Swelling   Prior to Admission medications   Medication Sig Start Date End Date Taking? Authorizing Provider  acetaminophen (TYLENOL) 650 MG CR tablet 2 tablets as needed Orally once a day   Yes [provider]  ALPRAZolam (XANAX) 0.25 MG tablet Take 0.25-0.5 mg by mouth 2 (two) times daily as needed (Restless leg).   Yes [provider]  amLODipine (NORVASC) 5 MG tablet Take 7.5 mg by mouth daily.   Yes [provider]  Ascorbic Acid (VITAMIN C) 1000 MG tablet Take 1,000 mg by mouth daily.   Yes [provider]  aspirin EC 325 MG tablet Take 325 mg by mouth daily.   Yes [provider]  aspirin EC 81 MG tablet Take 1 tablet (81 mg total) by mouth daily. Swallow whole. 02/05/23  Yes Cathren Laine, MD   cholecalciferol (VITAMIN D3) 25 MCG (1000 UNIT) tablet Take 1,000 Units by mouth daily.   Yes [provider]  clopidogrel (PLAVIX) 75 MG tablet Take 1 tablet (75 mg total) by mouth daily. 02/05/23  Yes Cathren Laine, MD  DULoxetine (CYMBALTA) 60 MG capsule Take 60 mg by mouth daily.   Yes [provider]  L-Methylfolate 15 MG TABS Take 7.5 mg by mouth daily. 06/23/22  Yes [provider]  loratadine (CLARITIN) 10 MG tablet Take 10 mg by mouth daily as needed for allergies.   Yes [provider]  Multiple Vitamins-Minerals (MULTIVITAMIN PO) Take 1 tablet by mouth daily. One a day   Yes [provider]  omeprazole (PRILOSEC) 20 MG capsule Take 20 mg by mouth daily.   Yes [provider]  pramipexole (MIRAPEX) 0.5 MG tablet Take 0.5 mg by mouth 2 (two) times daily. 05/06/22  Yes [provider]  rosuvastatin (CRESTOR) 40 MG tablet Take 1 tablet (40 mg total) by mouth daily. Patient taking differently: Take 40 mg by mouth at bedtime. 02/24/23  Yes Patwardhan, Anabel Bene, MD  ferrous sulfate 325 (65 FE) MG tablet Take 325 mg by mouth daily with breakfast. Patient not taking: Reported on 03/28/2023    [provider]    Family History Reviewed and non-contributory, no pertinent history of problems with bleeding or anesthesia      Review of Systems 14 system ROS conducted and negative except for that noted in HPI   OBJECTIVE  Vitals:Patient Vitals for the past 8 hrs:  BP Temp Temp src Pulse Resp SpO2  03/28/23 0845 115/69 -- -- 69 14 96 %  03/28/23 0830 107/67 -- -- 65 13 91 %  03/28/23 0741 -- 98.2 F (36.8 C) Oral -- -- --  03/28/23 0340 -- 98.1 F (36.7 C) Oral -- -- --  03/28/23 0330 109/66 -- -- 67 15 99 %  03/28/23 0315 106/69 -- -- 67 12 96 %  03/28/23 0300 112/64 -- -- 66 15 97 %  03/28/23 0245 107/62 -- -- 67 18 98 %  03/28/23 0230 107/70 -- -- 70 16 94 %   General: Alert, no acute distress Cardiovascular:  Warm extremities noted Respiratory: No cyanosis, no use of accessory musculature GI: No organomegaly, abdomen is soft and non-tender Skin: No lesions in the area of chief complaint other than those listed below in MSK exam.  Neurologic: Sensation intact distally save  for the below mentioned MSK exam Psychiatric: Patient is competent for consent with normal mood and affect Lymphatic: No swelling obvious and reported other than the area involved in the exam below Chest Skin intact no open wounds Moderate swelling with large hematoma at proximal clavicle extending proximally neck Tender of proximal clavicle Intact motor/sensory to RUE 2+ radial pulse  Test Results Imaging  CT HEAD WO CONTRAST ( ) Result Date: 03/28/2023 CLINICAL DATA:  Neck trauma (Age >= 65y); Head trauma, moderate-severe EXAM: CT HEAD WITHOUT CONTRAST CT CERVICAL SPINE WITHOUT CONTRAST TECHNIQUE: Multidetector CT imaging of the head and cervical spine was performed following the standard protocol without intravenous contrast. Multiplanar CT image reconstructions of the cervical spine were also generated. RADIATION DOSE REDUCTION: This exam was performed according to the departmental dose-optimization program which includes automated exposure control, adjustment of the mA and/or kV according to patient size and/or use of iterative reconstruction technique. COMPARISON:  CT head 02/04/2023. FINDINGS: CT HEAD FINDINGS Brain: No evidence of large-territorial acute infarction. No parenchymal hemorrhage. No mass lesion. No extra-axial collection. No mass effect or midline shift. No hydrocephalus. Basilar cisterns are patent. Vascular: No hyperdense vessel. Atherosclerotic calcifications are present within the cavernous internal carotid and vertebral arteries. Skull: No acute fracture or focal lesion. Sinuses/Orbits: Bilateral maxillary sinus mucosal thickening. Bilateral ethmoid mucosal thickening. Paranasal sinuses and mastoid air  cells are clear. The orbits are unremarkable. Other: None. CT CERVICAL SPINE FINDINGS Alignment: Grade 1 anterolisthesis of C4 on C5. Skull base and vertebrae: C5 - C7 anterior cervical discectomy and fusion. Multilevel severe degenerative changes spine most prominent at the C3-C4 levels. Associated severe osseous neural foraminal stenosis at the bilateral C3-C4 and C4-C5 levels. No acute fracture. No aggressive appearing focal osseous lesion or focal pathologic process. Soft tissues and spinal canal: No prevertebral fluid or swelling. No visible canal hematoma. Upper chest: Unremarkable. Other: Please see separately dictated CT chest 03/28/2023 regarding sternocleidomastoid hemorrhage in clavicular fracture. Severe atherosclerotic plaque of the carotid arteries within the neck. IMPRESSION: 1. No acute intracranial abnormality. 2. No acute displaced fracture or traumatic listhesis of the cervical spine. 3. Bilateral C3-C4 and C4-C5 osseous neuroforaminal stenosis. 4. C5 - C7 anterior cervical discectomy and fusion. 5. Please see separately dictated CT chest 03/28/2023 regarding sternocleidomastoid hemorrhage in clavicular fracture. 6. Severe atherosclerotic plaque of the carotid arteries within the neck. Electronically Signed   By: Tish Frederickson M.D.   On: 03/28/2023 02:08   CT CERVICAL SPINE WO CONTRAST Result Date: 03/28/2023 CLINICAL DATA:  Neck trauma (Age >= 65y); Head trauma, moderate-severe EXAM: CT HEAD WITHOUT CONTRAST CT CERVICAL SPINE WITHOUT CONTRAST TECHNIQUE: Multidetector CT imaging of the head and cervical spine was performed following the standard protocol without intravenous contrast. Multiplanar CT image reconstructions of the cervical spine were also generated. RADIATION DOSE REDUCTION: This exam was performed according to the departmental dose-optimization program which includes automated exposure control, adjustment of the mA and/or kV according to patient size and/or use of iterative  reconstruction technique. COMPARISON:  CT head 02/04/2023. FINDINGS: CT HEAD FINDINGS Brain: No evidence of large-territorial acute infarction. No parenchymal hemorrhage. No mass lesion. No extra-axial collection. No mass effect or midline shift. No hydrocephalus. Basilar cisterns are patent. Vascular: No hyperdense vessel. Atherosclerotic calcifications are present within the cavernous internal carotid and vertebral arteries. Skull: No acute fracture or focal lesion. Sinuses/Orbits: Bilateral maxillary sinus mucosal thickening. Bilateral ethmoid mucosal thickening. Paranasal sinuses and mastoid air cells are clear. The orbits are unremarkable. Other: None. CT  CERVICAL SPINE FINDINGS Alignment: Grade 1 anterolisthesis of C4 on C5. Skull base and vertebrae: C5 - C7 anterior cervical discectomy and fusion. Multilevel severe degenerative changes spine most prominent at the C3-C4 levels. Associated severe osseous neural foraminal stenosis at the bilateral C3-C4 and C4-C5 levels. No acute fracture. No aggressive appearing focal osseous lesion or focal pathologic process. Soft tissues and spinal canal: No prevertebral fluid or swelling. No visible canal hematoma. Upper chest: Unremarkable. Other: Please see separately dictated CT chest 03/28/2023 regarding sternocleidomastoid hemorrhage in clavicular fracture. Severe atherosclerotic plaque of the carotid arteries within the neck. IMPRESSION: 1. No acute intracranial abnormality. 2. No acute displaced fracture or traumatic listhesis of the cervical spine. 3. Bilateral C3-C4 and C4-C5 osseous neuroforaminal stenosis. 4. C5 - C7 anterior cervical discectomy and fusion. 5. Please see separately dictated CT chest 03/28/2023 regarding sternocleidomastoid hemorrhage in clavicular fracture. 6. Severe atherosclerotic plaque of the carotid arteries within the neck. Electronically Signed   By: Tish Frederickson M.D.   On: 03/28/2023 02:08   CT CHEST ABDOMEN PELVIS W CONTRAST Result  Date: 03/28/2023 CLINICAL DATA:  Polytrauma, blunt.  Fall EXAM: CT CHEST, ABDOMEN, AND PELVIS WITH CONTRAST TECHNIQUE: Multidetector CT imaging of the chest, abdomen and pelvis was performed following the standard protocol during bolus administration of intravenous contrast. RADIATION DOSE REDUCTION: This exam was performed according to the departmental dose-optimization program which includes automated exposure control, adjustment of the mA and/or kV according to patient size and/or use of iterative reconstruction technique. CONTRAST:  75mL OMNIPAQUE IOHEXOL 350 MG/ML SOLN COMPARISON:  PET CT cardiac 02/23/2023, x-ray lumbar spine 08/15/2022, x-ray lumbar spine 09/25/2022 FINDINGS: CHEST: Cardiovascular: No aortic injury. The ascending thoracic aorta measures at the upper limits of normal (4 cm). The aortic root measures at the upper limits of normal (4 cm). The descending thoracic aorta is normal in caliber. The heart is normal in size. No significant pericardial effusion. Mild atherosclerotic plaque. Four-vessel coronary calcification. Mediastinum/Nodes: No pneumomediastinum. No mediastinal hematoma. The esophagus is unremarkable. The thyroid is unremarkable. The central airways are patent. No mediastinal, hilar, or axillary lymphadenopathy. Lungs/Pleura: Bilateral lower lobe atelectasis. Mild paraseptal emphysematous changes. No focal consolidation. No pulmonary nodule. No pulmonary mass. No pulmonary contusion or laceration. No pneumatocele formation. No pleural effusion. No pneumothorax. No hemothorax. Musculoskeletal/Chest wall: No chest wall mass. Acute comminuted and displaced proximal right clavicular fracture. Associated right pectoralis and sternoclavicular asymmetric enlargement as well as heterogeneity with surrounding fat stranding. Old healed right mid to distal clavicular fracture. Moderate degenerative changes of the right shoulder. Mild degenerative changes left shoulder. No acute rib or sternal  fracture. No spinal fracture. ABDOMEN / PELVIS: Hepatobiliary: Not enlarged. No focal lesion. No laceration or subcapsular hematoma. The gallbladder is otherwise unremarkable with no radio-opaque gallstones. No biliary ductal dilatation. Pancreas: Normal pancreatic contour. No main pancreatic duct dilatation. Spleen: Not enlarged. No focal lesion. No laceration, subcapsular hematoma, or vascular injury. Adrenals/Urinary Tract: No nodularity bilaterally. Bilateral kidneys enhance symmetrically. No hydronephrosis. No contusion, laceration, or subcapsular hematoma. No injury to the vascular structures or collecting systems. No hydroureter. Limited evaluation during bladder lumen due to streak artifact originating from femoral surgical hardware. On delayed imaging, there is no urothelial wall thickening and there are no filling defects in the opacified portions of the bilateral collecting systems or ureters. Stomach/Bowel: No small or large bowel wall thickening or dilatation. The appendix is unremarkable. Vasculature/Lymphatics: Severe atherosclerotic plaque. No abdominal aorta or iliac aneurysm. No active contrast extravasation or pseudoaneurysm. No  abdominal, pelvic, inguinal lymphadenopathy. Reproductive: Normal. Other: No simple free fluid ascites. No pneumoperitoneum. No hemoperitoneum. No mesenteric hematoma identified. No organized fluid collection. Musculoskeletal: No significant soft tissue hematoma. No acute pelvic fracture. No spinal fracture. Stable grade 1 anterolisthesis of L4 on L5. Mild retrolisthesis of L1 on L2, L2 on L3, L3 on L4. Right proximal femoral surgical hardware. No right hip dislocation. Total left hip arthroplasty. L1-S1 posterolateral and interbody as well as left sacroiliac joint surgical hardware. No CT findings of surgical hardware complication. Ports and Devices: None. IMPRESSION: 1. Acute comminuted and displaced proximal right clavicular fracture. Associated right pectoralis and  sternoclavicular (large) musculature hematoma. No mass effect on the trachea noted. 2. No acute intrathoracic, intra-abdominal, intrapelvic traumatic injury. 3. No acute fracture or traumatic malalignment of the thoracic or lumbar spine. 4. Aneurysmal ascending thoracic aorta (4 cm). Recommend annual imaging followup by CTA or MRA. This recommendation follows 2010 ACCF/AHA/AATS/ACR/ASA/SCA/SCAI/SIR/STS/SVM Guidelines for the Diagnosis and Management of Patients with Thoracic Aortic Disease. Circulation. 2010; 121: Z610-R604. Aortic aneurysm NOS (ICD10-I71.9). 5.  Aortic Atherosclerosis (ICD10-I70.0). 6.  Emphysema (ICD10-J43.9)-trace. Electronically Signed   By: Tish Frederickson M.D.   On: 03/28/2023 01:58   DG Pelvis Portable Result Date: 03/28/2023 CLINICAL DATA:  Trauma EXAM: PORTABLE PELVIS 1-2 VIEWS COMPARISON:  CT abdomen pelvis 03/28/2023. FINDINGS: Right proximal femoral surgical hardware. No right hip dislocation. No right hip fracture. Total left hip arthroplasty. No radiographic findings to suggest surgical hardware complication. There is no evidence of pelvic fracture or diastasis. No pelvic bone lesions are seen. Lumbosacral and left sacroiliac joint surgical hardware noted. Vascular calcification. IMPRESSION: Negative for acute traumatic injury. Electronically Signed   By: Tish Frederickson M.D.   On: 03/28/2023 01:21   DG Knee Complete 4 Views Right Result Date: 03/28/2023 CLINICAL DATA:  fall EXAM: RIGHT KNEE - COMPLETE 4+ VIEW COMPARISON:  None Available. FINDINGS: Chondrocalcinosis. No evidence of fracture, dislocation, or joint effusion. Mild tricompartmental degenerative changes of the knee. Partially visualized old healed proximal mid fibular shaft fracture. Soft tissues are unremarkable. Vascular calcifications. IMPRESSION: No acute displaced fracture or dislocation. Electronically Signed   By: Tish Frederickson M.D.   On: 03/28/2023 01:18   DG Clavicle Right Result Date:  03/28/2023 CLINICAL DATA:  fall EXAM: RIGHT CLAVICLE - 2+ VIEWS COMPARISON:  None Available. FINDINGS: Acute comminuted and inferiorly displaced proximal right clavicular fracture. Old healed mid to distal right clavicular fracture. Acromioclavicular joint degenerative changes. Glenohumeral joint degenerative changes. Soft tissues are unremarkable. IMPRESSION: Acute comminuted and inferiorly displaced proximal right clavicular fracture. Electronically Signed   By: Tish Frederickson M.D.   On: 03/28/2023 01:16   DG Chest Port 1 View Result Date: 03/28/2023 CLINICAL DATA:  Trauma EXAM: PORTABLE CHEST 1 VIEW COMPARISON:  Cardiac pet CT 02/23/2023, chest x-ray 07/24/2015 FINDINGS: The heart and mediastinal contours are within normal limits. Atherosclerotic plaque. Low lung volumes with question of left lower lobe airspace opacity. No pulmonary edema. No pleural effusion. No pneumothorax. No acute osseous abnormality. IMPRESSION: 1. Low lung volumes with question of left lower lobe airspace opacity. Recommend repeat chest x-ray PA and lateral view further evaluation. 2.  Aortic Atherosclerosis (ICD10-I70.0). Electronically Signed   By: Tish Frederickson M.D.   On: 03/28/2023 00:45   Labs cbc Recent Labs    03/28/23 0010 03/28/23 0015  WBC 7.2  --   HGB 12.4* 12.6*  HCT 37.4* 37.0*  PLT 215  --     Labs inflam No results for input(s): "  CRP" in the last 72 hours.  Invalid input(s): "ESR"  Labs coag Recent Labs    03/28/23 0010  INR 1.0    Recent Labs    03/28/23 0010 03/28/23 0015  NA 136 138  K 4.2 4.0  CL 102 100  CO2 24  --   GLUCOSE 163* 175*  BUN 15 16  CREATININE 0.99 1.00  CALCIUM 8.9  --      ASSESSMENT AND PLAN: 72 y.o. male with the following: Right proximal clavicle fracture with associated hematoma -Patient being admitted to trauma service for monitoring of hematoma. -Likely plan for non-operative treatment in sling for clavicle fracture - Weight Bearing  Status/Activity: NWB RUE in sling -VTE Prophylaxis: per primary team, currently being held due to hematoma - Pain control: per primary team -Ortho to follow while inpatient. Patient can follow up 7-10 days after discharge

## 2023-03-28 NOTE — Evaluation (Signed)
Occupational Therapy Evaluation Patient Details Name: Larry Holloway MRN: [id number] DOB: [birth year] Today's Date: 03/28/2023   History of Present Illness Pt is a 72 y.o. male who presented 03/27/23 s/p fall down x13 stairs. Pt sustained an acute comminuted and displaced proximal R clavicular fx with associated R pectoralis and sternoclavicular musculature hematoma. PMH: arthritis, prostate cancer, HTN, pre-diabetes, lumbar surgery 07/2022   Clinical Impression   PTA patient independent. Admitted for above and presents with problem list below.  Mobilization with supervision in room, up to min assist for Adls.  Decreased recall of techniques taught by PT, provided handout on elbow/wrist/hand exercises, sling mgmt and shoulder protocol (only for written instructions of UB dressing/bathing) and pt voiced understanding.  He will have support of spouse at home.  Reports improved comfort in bed with sling on and shoulder supported with blankets under arm. Will follow acutely, recommend no OT follow up until cleared by MD for shoulder ROM.        If plan is discharge home, recommend the following: A little help with walking and/or transfers;A little help with bathing/dressing/bathroom;Assistance with cooking/housework;Assist for transportation;Help with stairs or ramp for entrance    Functional Status Assessment  Patient has had a recent decline in their functional status and demonstrates the ability to make significant improvements in function in a reasonable and predictable amount of time.  Equipment Recommendations  Tub/shower seat    Recommendations for Other Services       Precautions / Restrictions Precautions Required Braces or Orthoses: Sling Restrictions Weight Bearing Restrictions Per Provider Order: Yes RUE Weight Bearing Per Provider Order: Non weight bearing      Mobility Bed Mobility Overal bed mobility: Needs Assistance Bed Mobility: Supine to Sit, Sit to Supine      Supine to sit: Supervision, HOB elevated Sit to supine: Supervision, HOB elevated   General bed mobility comments: supervision, good adherance to NWB to R UE    Transfers Overall transfer level: Needs assistance Equipment used: None Transfers: Sit to/from Stand Sit to Stand: Supervision           General transfer comment: for safety      Balance Overall balance assessment: No apparent balance deficits (not formally assessed)                                         ADL either performed or assessed with clinical judgement   ADL Overall ADL's : Needs assistance/impaired     Grooming: Set up;Sitting           Upper Body Dressing : Minimal assistance;Sitting   Lower Body Dressing: Minimal assistance;Sit to/from stand Lower Body Dressing Details (indicate cue type and reason): assist with socks, reviewed 1 handed techniques Toilet Transfer: Ambulation;Supervision/safety   Toileting- Clothing Manipulation and Hygiene: Minimal assistance;Sit to/from stand       Functional mobility during ADLs: Supervision/safety       Vision   Vision Assessment?: No apparent visual deficits     Perception         Praxis         Pertinent Vitals/Pain Pain Assessment Pain Assessment: Faces Faces Pain Scale: Hurts little more Pain Location: R shoulder Pain Descriptors / Indicators: Discomfort, Grimacing, Guarding Pain Intervention(s): Limited activity within patient's tolerance, Monitored during session, Repositioned     Extremity/Trunk Assessment Upper Extremity Assessment Upper Extremity Assessment: Right hand dominant;RUE deficits/detail RUE  Deficits / Details: noted edema and bruising at R shoulder region, instructed pt to limit ROM due to noted clavicle fx, NWB orders- sling in place.  WFL elbow,wrist, hand RUE Sensation: WNL RUE Coordination: decreased gross motor   Lower Extremity Assessment Lower Extremity Assessment: Defer to PT  evaluation   Cervical / Trunk Assessment Cervical / Trunk Assessment: Normal   Communication Communication Communication: No apparent difficulties   Cognition Arousal: Alert Behavior During Therapy: WFL for tasks assessed/performed Overall Cognitive Status: Within Functional Limits for tasks assessed                                 General Comments: some decreased recall from PT session, but will continue to asses     General Comments  pt with poor recall of education provided by PT, printed handouts for elbow, wrist and hand exercises; shoulder dc handout for written instructions of bathing/dressing techniques, and sling mgmt handout    Exercises     Shoulder Instructions      Home Living Family/patient expects to be discharged to:: Private residence Living Arrangements: Spouse/significant other Available Help at Discharge: Family;Available PRN/intermittently Type of Home: House Home Access: Stairs to enter [employer] of Steps: 3-4 Entrance Stairs-Rails: Left Home Layout: Bed/bath upstairs;Multi-level;Able to live on main level with bedroom/bathroom (dont go up on 3rd floor much) Alternate Level Stairs-Number of Steps: 13 Alternate Level Stairs-Rails: Right;Left;Can reach both Bathroom Shower/Tub: Producer, television/film/video: Handicapped height     Home Equipment: Cane - single point          Prior Functioning/Environment Prior Level of Function : Independent/Modified Independent;Driving;Working/employed             Mobility Comments: No AD ADLs Comments: Is a full time student in [employer]        OT Problem List: Decreased strength;Decreased activity tolerance;Impaired balance (sitting and/or standing);Pain;Increased edema;Impaired UE functional use;Decreased knowledge of precautions;Decreased knowledge of use of DME or AE      OT Treatment/Interventions: Self-care/ADL training;Therapeutic exercise;DME  and/or AE instruction;Therapeutic activities;Patient/family education    OT Goals(Current goals can be found in the care plan section) Acute Rehab OT Goals Patient Stated Goal: home OT Goal Formulation: With patient Time For Goal Achievement: 04/11/23 Potential to Achieve Goals: Good  OT Frequency: Min 1X/week    Co-evaluation              AM-PAC OT "6 Clicks" Daily Activity     Outcome Measure Help from another person eating meals?: A Little Help from another person taking care of personal grooming?: A Little Help from another person toileting, which includes using toliet, bedpan, or urinal?: A Little Help from another person bathing (including washing, rinsing, drying)?: A Little Help from another person to put on and taking off regular upper body clothing?: A Little Help from another person to put on and taking off regular lower body clothing?: A Little 6 Click Score: 18   End of Session Nurse Communication: Mobility status  Activity Tolerance: Patient tolerated treatment well Patient left: in bed;with call bell/phone within reach  OT Visit Diagnosis: Other abnormalities of gait and mobility (R26.89);Pain Pain - Right/Left: Right Pain - part of body: Shoulder                Time: 1610-9604 OT Time Calculation (min): 16 min Charges:  OT General Charges $OT Visit: 1 Visit OT Evaluation $  OT Eval Moderate Complexity: 1 Mod  Barry Brunner, OT Acute Rehabilitation Services Office [phone]   Chancy Milroy 03/28/2023, 2:29 PM

## 2023-03-28 NOTE — Progress Notes (Signed)
Transition of Care [facility]) - CAGE-AID Screening   Patient Details  Name: Larry Holloway MRN: [id number] Date of Birth: 1950/05/11  [facility]) CM/SW Contact:    Leota Sauers, RN Phone Number: 03/28/2023, 8:31 PM   Clinical Narrative:  Patient denies the use of alcohol and illicit drugs. Resources not given at this time.  CAGE-AID Screening:    Have You Ever Felt You Ought to Cut Down on Your Drinking or Drug Use?: No Have People Annoyed You By Critizing Your Drinking Or Drug Use?: No Have You Felt Bad Or Guilty About Your Drinking Or Drug Use?: No Have You Ever Had a Drink or Used Drugs First Thing In The Morning to Steady Your Nerves or to Get Rid of a Hangover?: No CAGE-AID Score: 0  Substance Abuse Education Offered: No

## 2023-03-28 NOTE — Consult Note (Addendum)
[facility] [place]  ASSESSMENT / PLAN: 72 y.o. male with possibly symptomatic left carotid artery stenosis.  Patient was seen by Dr. Randie Heinz for the same on 02/24/2023.  He has a large hematoma about his right clavicle which may make carotid duplex painful.  I will arrange for CT angiogram while he is admitted.  I or Dr. Randie Heinz will check in with him tomorrow to review the CT scan results.  CHIEF COMPLAINT: Fall  HISTORY OF PRESENT ILLNESS: Larry Holloway is a 72 y.o. male known to our service with possibly symptomatic left carotid artery stenosis.  The patient was seen by Dr. Randie Heinz the same on 02/24/2023 after recent ER visit was done for left visual changes.  The patient had an MR angiogram of the head and neck performed which showed left carotid artery stenosis of 70%.  Dr. Randie Heinz reviewed the studies and explained to the patient that MRI frequently overestimates stenosis, and arrange for an outpatient carotid duplex.  Unfortunately the patient suffered a mechanical fall yesterday evening, causing a complex fracture of his right clavicle.  He has significant hematoma in his neck and chest.  Dr. Bedelia Person called me earlier today, to review the scenario.  She and I both feel that a carotid duplex would likely not be technically feasible given the swelling and pain that the patient has at the base of his neck.  On my evaluation, the patient reports no strokelike symptoms since seeing Dr. Randie Heinz.  He does not report any unilateral weakness or numbness.  He reports he simply tripped and fell trying to turn out a light in his house.   Past Medical History:  Diagnosis Date   Arthritis    Back   Back pain    Cancer of prostate (HCC)    Carotid artery occlusion    Central serous retinopathy    High blood pressure    High cholesterol    Mild depression    Neck pain    PONV (postoperative nausea and vomiting)    Pre-diabetes    Right arm pain     Past Surgical History:  Procedure  Laterality Date   ANTERIOR LAT LUMBAR FUSION N/A 08/10/2022   Procedure: Lumbar One-Two, Lumbar Two-Three, Lumbar Three-Four Anterior Lateral Lumbar Fusion;  Surgeon: Barnett Abu, MD;  Location: [facility] OR;  Service: Neurosurgery;  Laterality: N/A;   CATARACT EXTRACTION W/ INTRAOCULAR LENS IMPLANT Bilateral 2022   COLONOSCOPY  2023   FRACTURE SURGERY Right 1975   Right Leg   HIP SURGERY Right 2002   Ball and Socket repaired   NECK SURGERY  2014   Cadaver disc placed in neck   PROSTATE SURGERY     2007    Family History  Problem Relation Age of Onset   Thyroid disease Mother    Heart disease Mother    Cancer Mother    High blood pressure Mother    Colon polyps Mother    Cancer - Lung Mother    Colon polyps Father    CAD Father    Cancer - Prostate Father    High blood pressure Father    Heart disease Father    COPD Father    CVA Father    Cancer - Prostate Brother     Social History   Socioeconomic History   Marital status: Married    Spouse name: Not on file   Number of children: Not on file   Years of education: Not on file  Highest education level: Not on file  Occupational History   Not on file  Tobacco Use   Smoking status: Former    Current packs/day: 0.00    Types: Cigarettes    Quit date: 1995    Years since quitting: 30.0   Smokeless tobacco: Never   Tobacco comments:    July 1995  Vaping Use   Vaping status: Never Used  Substance and Sexual Activity   Alcohol use: No    Comment: quit Oct.1996   Drug use: No   Sexual activity: Yes  Other Topics Concern   Not on file  Social History Narrative   Patient lives at home with his wife Rosanna Randy . Patient works for himself and work in Airline pilot.  Patient has two children.   Social Drivers of Corporate investment banker Strain: Not on file  Food Insecurity: No Food Insecurity (08/14/2022)   Hunger Vital Sign    Worried About Running Out of Food in the Last Year: Never true    Ran Out of Food in the Last  Year: Never true  Transportation Needs: No Transportation Needs (08/14/2022)   PRAPARE - Administrator, Civil Service (Medical): No    Lack of Transportation (Non-Medical): No  Physical Activity: Not on file  Stress: Not on file  Social Connections: Not on file  Intimate Partner Violence: Not At Risk (08/14/2022)   Humiliation, Afraid, Rape, and Kick questionnaire    Fear of Current or Ex-Partner: No    Emotionally Abused: No    Physically Abused: No    Sexually Abused: No    Allergies  Allergen Reactions   Ace Inhibitors Cough   Corticosteroids     Other Reaction(s): Central seroud retinopathy   Tape Swelling   Wound Dressing Adhesive Swelling    Current Facility-Administered Medications  Medication Dose Route Frequency Provider Last Rate Last Admin   acetaminophen (TYLENOL) tablet 1,000 mg  1,000 mg Oral Q6H Lovick, Lennie Odor, MD   1,000 mg at 03/28/23 8295   docusate sodium (COLACE) capsule 100 mg  100 mg Oral BID Diamantina Monks, MD       hydrALAZINE (APRESOLINE) injection 10 mg  10 mg Intravenous Q2H PRN Diamantina Monks, MD       methocarbamol (ROBAXIN) tablet 500 mg  500 mg Oral Q8H Diamantina Monks, MD   500 mg at 03/28/23 6213   Or   methocarbamol (ROBAXIN) injection 500 mg  500 mg Intravenous Q8H Lovick, Lennie Odor, MD       metoprolol tartrate (LOPRESSOR) injection 5 mg  5 mg Intravenous Q6H PRN Diamantina Monks, MD       ondansetron (ZOFRAN-ODT) disintegrating tablet 4 mg  4 mg Oral Q6H PRN Diamantina Monks, MD       Or   ondansetron (ZOFRAN) injection 4 mg  4 mg Intravenous Q6H PRN Diamantina Monks, MD       oxyCODONE (Oxy IR/ROXICODONE) immediate release tablet 2.5-5 mg  2.5-5 mg Oral Q4H PRN Diamantina Monks, MD   5 mg at 03/28/23 0649   polyethylene glycol (MIRALAX / GLYCOLAX) packet 17 g  17 g Oral Daily PRN Diamantina Monks, MD       Current Outpatient Medications  Medication Sig Dispense Refill   acetaminophen (TYLENOL) 650 MG CR tablet 2 tablets  as needed Orally once a day     ALPRAZolam (XANAX) 0.25 MG tablet Take 0.25-0.5 mg by mouth 2 (two) times  daily as needed (Restless leg).     amLODipine (NORVASC) 5 MG tablet Take 7.5 mg by mouth daily.     Ascorbic Acid (VITAMIN C) 1000 MG tablet Take 1,000 mg by mouth daily.     aspirin EC 325 MG tablet Take 325 mg by mouth daily.     aspirin EC 81 MG tablet Take 1 tablet (81 mg total) by mouth daily. Swallow whole. 30 tablet 0   cholecalciferol (VITAMIN D3) 25 MCG (1000 UNIT) tablet Take 1,000 Units by mouth daily.     clopidogrel (PLAVIX) 75 MG tablet Take 1 tablet (75 mg total) by mouth daily. 30 tablet 0   DULoxetine (CYMBALTA) 60 MG capsule Take 60 mg by mouth daily.     L-Methylfolate 15 MG TABS Take 7.5 mg by mouth daily.     loratadine (CLARITIN) 10 MG tablet Take 10 mg by mouth daily as needed for allergies.     Multiple Vitamins-Minerals (MULTIVITAMIN PO) Take 1 tablet by mouth daily. One a day     omeprazole (PRILOSEC) 20 MG capsule Take 20 mg by mouth daily.     pramipexole (MIRAPEX) 0.5 MG tablet Take 0.5 mg by mouth 2 (two) times daily.     rosuvastatin (CRESTOR) 40 MG tablet Take 1 tablet (40 mg total) by mouth daily. (Patient taking differently: Take 40 mg by mouth at bedtime.) 90 tablet 2   ferrous sulfate 325 (65 FE) MG tablet Take 325 mg by mouth daily with breakfast. (Patient not taking: Reported on 03/28/2023)      PHYSICAL EXAM Vitals:   03/28/23 0741 03/28/23 0830 03/28/23 0845 03/28/23 1137  BP:  107/67 115/69   Pulse:  65 69   Resp:  13 14   Temp: 98.2 F (36.8 C)   97.7 F (36.5 C)  TempSrc: Oral   Oral  SpO2:  91% 96%   Weight:      Height:       Well-appearing elderly man in no distress Regular rate and rhythm Unlabored breathing No cranial nerve abnormalities Right periclavicular area swollen and ecchymotic consistent with known complex clavicle fracture and hematoma Right arm in a sling  PERTINENT LABORATORY AND RADIOLOGIC DATA  Most recent  CBC    Latest Ref Rng & Units 03/28/2023   12:15 AM 03/28/2023   12:10 AM 02/04/2023    3:09 PM  CBC  WBC 4.0 - 10.5 K/uL  7.2  5.1   Hemoglobin 13.0 - 17.0 g/dL 16.1  09.6  04.5   Hematocrit 39.0 - 52.0 % 37.0  37.4  38.7   Platelets 150 - 400 K/uL  215  238      Most recent CMP    Latest Ref Rng & Units 03/28/2023   12:15 AM 03/28/2023   12:10 AM 02/04/2023    3:09 PM  CMP  Glucose 70 - 99 mg/dL 409  811  99   BUN 8 - 23 mg/dL 16  15  14    Creatinine 0.61 - 1.24 mg/dL 9.14  7.82  9.56   Sodium 135 - 145 mmol/L 138  136  136   Potassium 3.5 - 5.1 mmol/L 4.0  4.2  3.9   Chloride 98 - 111 mmol/L 100  102  103   CO2 22 - 32 mmol/L  24  25   Calcium 8.9 - 10.3 mg/dL  8.9  9.1   Total Protein 6.5 - 8.1 g/dL  6.4  6.4   Total Bilirubin <1.2 mg/dL  0.4  0.8   Alkaline Phos 38 - 126 U/L  67  73   AST 15 - 41 U/L  28  22   ALT 0 - 44 U/L  26  23    Hatsumi Steinhart N. Lenell Antu, MD FACS [facility] Phone Number: [phone] 03/28/2023 12:15 PM  Medium complexity visit based on chart review, review of prior stroke workup, review of trauma workup, discussion with other physicians, exam and history taking for patient.   Portions of this report may have been transcribed using voice recognition software.  Every effort has been made to ensure accuracy; however, inadvertent computerized transcription errors may still be present.

## 2023-03-28 NOTE — ED Notes (Signed)
Ortho tech notified.  

## 2023-03-28 NOTE — Progress Notes (Signed)
Orthopedic Tech Progress Note Patient Details:  Larry Holloway 1951-01-29 [id number]  Patient ID: Larry Holloway, male   DOB: [birth year], 72 y.o.   MRN: [id number] I attended trauma page. [name] 03/28/2023, 1:33 AM

## 2023-03-28 NOTE — Progress Notes (Signed)
Orthopedic Tech Progress Note Patient Details:  Larry Holloway 06-18-1950 [id number]  Ortho Devices Type of Ortho Device: Shoulder immobilizer Ortho Device/Splint Location: RUE Ortho Device/Splint Interventions: Ordered, Application, Adjustment   Post Interventions Patient Tolerated: Well Instructions Provided: Care of device, Adjustment of device  [name] 03/28/2023, 10:44 AM

## 2023-03-28 NOTE — Evaluation (Signed)
Physical Therapy Evaluation & Discharge Patient Details Name: Larry Holloway MRN: [id number] DOB: 05/08/1950 Today's Date: 03/28/2023  History of Present Illness  Pt is a 72 y.o. male who presented 03/27/23 s/p fall down x13 stairs. Pt sustained an acute comminuted and displaced proximal R clavicular fx with associated R pectoralis and sternoclavicular musculature hematoma. PMH: arthritis, prostate cancer, HTN, pre-diabetes, lumbar surgery 07/2022   Clinical Impression  Pt presents with condition above. Pt lives with his wife in a 3-level house with 3-4 STE and x13 stairs to his bedroom on the 2nd level. He is currently mobilizing at his baseline, not needing any physical assistance or needed an AD. He was educated on his R UE NWB precautions and to limit R shoulder ROM to allow for optimal healing of his fx. Educated pt on performing AROM of R elbow, wrist, and hand frequently and reducing excess R shoulder motion. Educated pt on how to donn/doff a shirt and maintain his precautions with ADLs and mobility. Pt verbalized and demonstrated understanding with all education. All education completed and questions answered. PT will sign off.      If plan is discharge home, recommend the following: Assistance with cooking/housework;Assist for transportation   Can travel by private vehicle        Equipment Recommendations None recommended by PT  Recommendations for Other Services       Functional Status Assessment Patient has not had a recent decline in their functional status     Precautions / Restrictions Precautions Required Braces or Orthoses: Sling (potentially, no order placed yet) Restrictions Weight Bearing Restrictions Per Provider Order: Yes RUE Weight Bearing Per Provider Order: Non weight bearing      Mobility  Bed Mobility Overal bed mobility: Needs Assistance Bed Mobility: Supine to Sit, Sit to Supine     Supine to sit: Supervision, HOB elevated Sit to supine:  Supervision, HOB elevated   General bed mobility comments: Educated pt to not push through his R UE to enter/exit R side of bed (sleeps on the R side of the bed at home), poor compliance the first rep supine > sit but good compliance the second rep. Discussed pt maybe sleeping on the L side of the bed at d/c to improve his ease with mobility and complying to his R UE NWB orders.    Transfers Overall transfer level: Independent Equipment used: None               General transfer comment: No LOB    Ambulation/Gait Ambulation/Gait assistance: Independent Gait Distance (Feet): 200 Feet (x2 bouts of ~40 ft > ~200 ft) Assistive device: None Gait Pattern/deviations: WFL(Within Functional Limits) Gait velocity: WFL Gait velocity interpretation: >4.37 ft/sec, indicative of normal walking speed   General Gait Details: No significant gait deviations noted, no LOB  Stairs            Wheelchair Mobility     Tilt Bed    Modified Rankin (Stroke Patients Only)       Balance Overall balance assessment: No apparent balance deficits (not formally assessed)                                           Pertinent Vitals/Pain Pain Assessment Pain Assessment: 0-10 Pain Score: 4  Pain Location: R shoulder Pain Descriptors / Indicators: Discomfort, Grimacing, Guarding Pain Intervention(s): Limited activity within patient's tolerance, Monitored during  session, Repositioned    Home Living Family/patient expects to be discharged to:: Private residence Living Arrangements: Spouse/significant other Available Help at Discharge: Family;Available PRN/intermittently Type of Home: House Home Access: Stairs to enter Entrance Stairs-Rails: Left Entrance Stairs-Number of Steps: 3-4 Alternate Level Stairs-Number of Steps: 13 Home Layout: Bed/bath upstairs;Multi-level;Able to live on main level with bedroom/bathroom (dont go up on 3rd floor much) Home Equipment: Cane -  single point      Prior Function Prior Level of Function : Independent/Modified Independent;Driving;Working/employed             Mobility Comments: No AD ADLs Comments: Is a full time student in [employer]     Extremity/Trunk Assessment   Upper Extremity Assessment Upper Extremity Assessment: RUE deficits/detail RUE Deficits / Details: noted edema and bruising at R shoulder region, instructed pt to limit ROM due to noted clavicle fx, NWB orders    Lower Extremity Assessment Lower Extremity Assessment: Overall WFL for tasks assessed    Cervical / Trunk Assessment Cervical / Trunk Assessment: Normal  Communication   Communication Communication: No apparent difficulties  Cognition Arousal: Alert Behavior During Therapy: WFL for tasks assessed/performed Overall Cognitive Status: Within Functional Limits for tasks assessed                                          General Comments General comments (skin integrity, edema, etc.): educated pt on performing AROM of R elbow, wrist and hand frequently and reducing excess R shoulder motion; educated pt on R UE NWB orders; educated pt on how to donn/doff a shirt and maintain his precautions with ADLs; pt verbalized understanding with all education    Exercises     Assessment/Plan    PT Assessment Patient does not need any further PT services  PT Problem List         PT Treatment Interventions      PT Goals (Current goals can be found in the Care Plan section)  Acute Rehab PT Goals Patient Stated Goal: to go home soon PT Goal Formulation: All assessment and education complete, DC therapy Time For Goal Achievement: 03/29/23 Potential to Achieve Goals: Good    Frequency       Co-evaluation               AM-PAC PT "6 Clicks" Mobility  Outcome Measure Help needed turning from your back to your side while in a flat bed without using bedrails?: A Little Help needed moving from  lying on your back to sitting on the side of a flat bed without using bedrails?: A Little Help needed moving to and from a bed to a chair (including a wheelchair)?: None Help needed standing up from a chair using your arms (e.g., wheelchair or bedside chair)?: None Help needed to walk in hospital room?: None Help needed climbing 3-5 steps with a railing? : None 6 Click Score: 22    End of Session   Activity Tolerance: Patient tolerated treatment well Patient left: in bed;with call bell/phone within reach Nurse Communication: Mobility status (NT) PT Visit Diagnosis: Pain Pain - Right/Left: Right Pain - part of body: Shoulder    Time: 2130-8657 PT Time Calculation (min) (ACUTE ONLY): 19 min   Charges:   PT Evaluation $PT Eval Low Complexity: 1 Low   PT General Charges $$ ACUTE PT VISIT: 1 Visit  Virgil Benedict, PT, DPT Acute Rehabilitation Services  Office: [phone]   Bettina Gavia 03/28/2023, 9:16 AM

## 2023-03-28 NOTE — ED Notes (Signed)
ED TO INPATIENT HANDOFF REPORT  ED Nurse Name and Phone #: Donnal Debar [phone]  S Name/Age/Gender Larry Holloway 72 y.o. male Room/Bed: 017C/017C  Code Status   Code Status: Full Code  Home/SNF/Other Home Patient oriented to: self, place, time, and situation Is this baseline? Yes   Triage Complete: Triage complete  Chief Complaint Hematoma of neck [S10.93XA]  Triage Note Pt reports he fell tonight when walking down the steps, approx 12-13 steps. + head injury. He denies LOC but he does take Plavix. Last dose this morning. He has abrasions to the top of his head. He is reporting right shoulder pain, visible swelling and deformity. Swelling and abrasions noted to right knee as well.   Allergies Allergies  Allergen Reactions   Tape Swelling   Wound Dressing Adhesive Swelling    Level of Care/Admitting Diagnosis ED Disposition     ED Disposition  Admit   Condition  --   Comment  Hospital Area: [facility] [[id number]]  Level of Care: Progressive [102]  Admit to Progressive based on following criteria: RESPIRATORY PROBLEMS hypoxemic/hypercapnic respiratory failure that is responsive to NIPPV (BiPAP) or High Flow Nasal Cannula (6-80 lpm). Frequent assessment/intervention, no > Q2 hrs < Q4 hrs, to maintain oxygenation and pulmonary hygiene.  May place patient in observation at [facility] or [facility] if equivalent level of care is available:: No  Covid Evaluation: Asymptomatic - no recent exposure (last 10 days) testing not required  Diagnosis: Hematoma of neck [865784]  Admitting Physician: TRAUMA MD [[id number]]  Attending Physician: TRAUMA MD [[id number]]  For patients discharging to extended facilities (i.e. SNF, AL, group homes or LTAC) initiate:: Discharge to SNF/Facility Placement COVID-19 Lab Testing Protocol          B Medical/Surgery History Past Medical History:  Diagnosis Date   Arthritis    Back   Back pain    Cancer of prostate (HCC)     Carotid artery occlusion    Central serous retinopathy    High blood pressure    High cholesterol    Mild depression    Neck pain    PONV (postoperative nausea and vomiting)    Pre-diabetes    Right arm pain    Past Surgical History:  Procedure Laterality Date   ANTERIOR LAT LUMBAR FUSION N/A 08/10/2022   Procedure: Lumbar One-Two, Lumbar Two-Three, Lumbar Three-Four Anterior Lateral Lumbar Fusion;  Surgeon: Barnett Abu, MD;  Location: [facility] OR;  Service: Neurosurgery;  Laterality: N/A;   CATARACT EXTRACTION W/ INTRAOCULAR LENS IMPLANT Bilateral 2022   COLONOSCOPY  2023   FRACTURE SURGERY Right 1975   Right Leg   HIP SURGERY Right 2002   Ball and Socket repaired   NECK SURGERY  2014   Cadaver disc placed in neck   PROSTATE SURGERY     2007     A IV Location/Drains/Wounds Patient Lines/Drains/Airways Status     Active Line/Drains/Airways     Name Placement date Placement time Site Days   Peripheral IV 03/28/23 20 G Left Wrist 03/28/23  0007  Wrist  less than 1            Intake/Output Last 24 hours No intake or output data in the 24 hours ending 03/28/23 6962  Labs/Imaging Results for orders placed or performed during the hospital encounter of 03/27/23 (from the past 48 hours)  Comprehensive metabolic panel     Status: Abnormal   Collection Time: 03/28/23 12:10 AM  Result Value Ref Range  Sodium 136 135 - 145 mmol/L   Potassium 4.2 3.5 - 5.1 mmol/L   Chloride 102 98 - 111 mmol/L   CO2 24 22 - 32 mmol/L   Glucose, Bld 163 (H) 70 - 99 mg/dL    Comment: Glucose reference range applies only to samples taken after fasting for at least 8 hours.   BUN 15 8 - 23 mg/dL   Creatinine, Ser 1.61 0.61 - 1.24 mg/dL   Calcium 8.9 8.9 - 09.6 mg/dL   Total Protein 6.4 (L) 6.5 - 8.1 g/dL   Albumin 3.7 3.5 - 5.0 g/dL   AST 28 15 - 41 U/L   ALT 26 0 - 44 U/L   Alkaline Phosphatase 67 38 - 126 U/L   Total Bilirubin 0.4 <1.2 mg/dL   GFR, Estimated >04 >54 mL/min    Comment:  (NOTE) Calculated using the CKD-EPI Creatinine Equation (2021)    Anion gap 10 5 - 15    Comment: Performed at [facility], [address] [place], [place] [address]  CBC     Status: Abnormal   Collection Time: 03/28/23 12:10 AM  Result Value Ref Range   WBC 7.2 4.0 - 10.5 K/uL   RBC 4.13 (L) 4.22 - 5.81 MIL/uL   Hemoglobin 12.4 (L) 13.0 - 17.0 g/dL   HCT 91.4 (L) 78.2 - 95.6 %   MCV 90.6 80.0 - 100.0 fL   MCH 30.0 26.0 - 34.0 pg   MCHC 33.2 30.0 - 36.0 g/dL   RDW 21.3 08.6 - 57.8 %   Platelets 215 150 - 400 K/uL   nRBC 0.0 0.0 - 0.2 %    Comment: Performed at [facility], [address], [place], [place] [address]  Ethanol     Status: None   Collection Time: 03/28/23 12:10 AM  Result Value Ref Range   Alcohol, Ethyl (B) <10 <10 mg/dL    Comment: (NOTE) Lowest detectable limit for serum alcohol is 10 mg/dL.  For medical purposes only. Performed at [facility], [address], [place], [place] [address]   Protime-INR     Status: None   Collection Time: 03/28/23 12:10 AM  Result Value Ref Range   Prothrombin Time 13.3 11.4 - 15.2 seconds   INR 1.0 0.8 - 1.2    Comment: (NOTE) INR goal varies based on device and disease states. Performed at [facility], [address], [place], [place] [address]   Sample to Blood Bank     Status: None   Collection Time: 03/28/23 12:10 AM  Result Value Ref Range   Blood Bank Specimen SAMPLE AVAILABLE FOR TESTING    Sample Expiration      03/31/2023,2359 Performed at [facility], [address] [place], [place] [address]   I-Stat Chem 8, ED     Status: Abnormal   Collection Time: 03/28/23 12:15 AM  Result Value Ref Range   Sodium 138 135 - 145 mmol/L   Potassium 4.0 3.5 - 5.1 mmol/L   Chloride 100 98 - 111 mmol/L   BUN 16 8 - 23 mg/dL   Creatinine, Ser 2.53 0.61 - 1.24 mg/dL   Glucose, Bld 664 (H) 70 - 99 mg/dL    Comment: Glucose reference range applies only to samples taken after fasting for  at least 8 hours.   Calcium, Ion 1.15 1.15 - 1.40 mmol/L   TCO2 26 22 - 32 mmol/L   Hemoglobin 12.6 (L) 13.0 -  17.0 g/dL   HCT 16.1 (L) 09.6 - 04.5 %  I-Stat Lactic Acid, ED     Status: None   Collection Time: 03/28/23 12:19 AM  Result Value Ref Range   Lactic Acid, Venous 1.2 0.5 - 1.9 mmol/L   CT HEAD WO CONTRAST ( ) Result Date: 03/28/2023 CLINICAL DATA:  Neck trauma (Age >= 65y); Head trauma, moderate-severe EXAM: CT HEAD WITHOUT CONTRAST CT CERVICAL SPINE WITHOUT CONTRAST TECHNIQUE: Multidetector CT imaging of the head and cervical spine was performed following the standard protocol without intravenous contrast. Multiplanar CT image reconstructions of the cervical spine were also generated. RADIATION DOSE REDUCTION: This exam was performed according to the departmental dose-optimization program which includes automated exposure control, adjustment of the mA and/or kV according to patient size and/or use of iterative reconstruction technique. COMPARISON:  CT head 02/04/2023. FINDINGS: CT HEAD FINDINGS Brain: No evidence of large-territorial acute infarction. No parenchymal hemorrhage. No mass lesion. No extra-axial collection. No mass effect or midline shift. No hydrocephalus. Basilar cisterns are patent. Vascular: No hyperdense vessel. Atherosclerotic calcifications are present within the cavernous internal carotid and vertebral arteries. Skull: No acute fracture or focal lesion. Sinuses/Orbits: Bilateral maxillary sinus mucosal thickening. Bilateral ethmoid mucosal thickening. Paranasal sinuses and mastoid air cells are clear. The orbits are unremarkable. Other: None. CT CERVICAL SPINE FINDINGS Alignment: Grade 1 anterolisthesis of C4 on C5. Skull base and vertebrae: C5 - C7 anterior cervical discectomy and fusion. Multilevel severe degenerative changes spine most prominent at the C3-C4 levels. Associated severe osseous neural foraminal stenosis at the bilateral C3-C4 and C4-C5 levels. No acute  fracture. No aggressive appearing focal osseous lesion or focal pathologic process. Soft tissues and spinal canal: No prevertebral fluid or swelling. No visible canal hematoma. Upper chest: Unremarkable. Other: Please see separately dictated CT chest 03/28/2023 regarding sternocleidomastoid hemorrhage in clavicular fracture. Severe atherosclerotic plaque of the carotid arteries within the neck. IMPRESSION: 1. No acute intracranial abnormality. 2. No acute displaced fracture or traumatic listhesis of the cervical spine. 3. Bilateral C3-C4 and C4-C5 osseous neuroforaminal stenosis. 4. C5 - C7 anterior cervical discectomy and fusion. 5. Please see separately dictated CT chest 03/28/2023 regarding sternocleidomastoid hemorrhage in clavicular fracture. 6. Severe atherosclerotic plaque of the carotid arteries within the neck. Electronically Signed   By: Tish Frederickson M.D.   On: 03/28/2023 02:08   CT CERVICAL SPINE WO CONTRAST Result Date: 03/28/2023 CLINICAL DATA:  Neck trauma (Age >= 65y); Head trauma, moderate-severe EXAM: CT HEAD WITHOUT CONTRAST CT CERVICAL SPINE WITHOUT CONTRAST TECHNIQUE: Multidetector CT imaging of the head and cervical spine was performed following the standard protocol without intravenous contrast. Multiplanar CT image reconstructions of the cervical spine were also generated. RADIATION DOSE REDUCTION: This exam was performed according to the departmental dose-optimization program which includes automated exposure control, adjustment of the mA and/or kV according to patient size and/or use of iterative reconstruction technique. COMPARISON:  CT head 02/04/2023. FINDINGS: CT HEAD FINDINGS Brain: No evidence of large-territorial acute infarction. No parenchymal hemorrhage. No mass lesion. No extra-axial collection. No mass effect or midline shift. No hydrocephalus. Basilar cisterns are patent. Vascular: No hyperdense vessel. Atherosclerotic calcifications are present within the cavernous  internal carotid and vertebral arteries. Skull: No acute fracture or focal lesion. Sinuses/Orbits: Bilateral maxillary sinus mucosal thickening. Bilateral ethmoid mucosal thickening. Paranasal sinuses and mastoid air cells are clear. The orbits are unremarkable. Other: None. CT CERVICAL SPINE FINDINGS Alignment: Grade 1 anterolisthesis of C4 on C5. Skull base and vertebrae: C5 - C7 anterior  cervical discectomy and fusion. Multilevel severe degenerative changes spine most prominent at the C3-C4 levels. Associated severe osseous neural foraminal stenosis at the bilateral C3-C4 and C4-C5 levels. No acute fracture. No aggressive appearing focal osseous lesion or focal pathologic process. Soft tissues and spinal canal: No prevertebral fluid or swelling. No visible canal hematoma. Upper chest: Unremarkable. Other: Please see separately dictated CT chest 03/28/2023 regarding sternocleidomastoid hemorrhage in clavicular fracture. Severe atherosclerotic plaque of the carotid arteries within the neck. IMPRESSION: 1. No acute intracranial abnormality. 2. No acute displaced fracture or traumatic listhesis of the cervical spine. 3. Bilateral C3-C4 and C4-C5 osseous neuroforaminal stenosis. 4. C5 - C7 anterior cervical discectomy and fusion. 5. Please see separately dictated CT chest 03/28/2023 regarding sternocleidomastoid hemorrhage in clavicular fracture. 6. Severe atherosclerotic plaque of the carotid arteries within the neck. Electronically Signed   By: Tish Frederickson M.D.   On: 03/28/2023 02:08   CT CHEST ABDOMEN PELVIS W CONTRAST Result Date: 03/28/2023 CLINICAL DATA:  Polytrauma, blunt.  Fall EXAM: CT CHEST, ABDOMEN, AND PELVIS WITH CONTRAST TECHNIQUE: Multidetector CT imaging of the chest, abdomen and pelvis was performed following the standard protocol during bolus administration of intravenous contrast. RADIATION DOSE REDUCTION: This exam was performed according to the departmental dose-optimization program which  includes automated exposure control, adjustment of the mA and/or kV according to patient size and/or use of iterative reconstruction technique. CONTRAST:  75mL OMNIPAQUE IOHEXOL 350 MG/ML SOLN COMPARISON:  PET CT cardiac 02/23/2023, x-ray lumbar spine 08/15/2022, x-ray lumbar spine 09/25/2022 FINDINGS: CHEST: Cardiovascular: No aortic injury. The ascending thoracic aorta measures at the upper limits of normal (4 cm). The aortic root measures at the upper limits of normal (4 cm). The descending thoracic aorta is normal in caliber. The heart is normal in size. No significant pericardial effusion. Mild atherosclerotic plaque. Four-vessel coronary calcification. Mediastinum/Nodes: No pneumomediastinum. No mediastinal hematoma. The esophagus is unremarkable. The thyroid is unremarkable. The central airways are patent. No mediastinal, hilar, or axillary lymphadenopathy. Lungs/Pleura: Bilateral lower lobe atelectasis. Mild paraseptal emphysematous changes. No focal consolidation. No pulmonary nodule. No pulmonary mass. No pulmonary contusion or laceration. No pneumatocele formation. No pleural effusion. No pneumothorax. No hemothorax. Musculoskeletal/Chest wall: No chest wall mass. Acute comminuted and displaced proximal right clavicular fracture. Associated right pectoralis and sternoclavicular asymmetric enlargement as well as heterogeneity with surrounding fat stranding. Old healed right mid to distal clavicular fracture. Moderate degenerative changes of the right shoulder. Mild degenerative changes left shoulder. No acute rib or sternal fracture. No spinal fracture. ABDOMEN / PELVIS: Hepatobiliary: Not enlarged. No focal lesion. No laceration or subcapsular hematoma. The gallbladder is otherwise unremarkable with no radio-opaque gallstones. No biliary ductal dilatation. Pancreas: Normal pancreatic contour. No main pancreatic duct dilatation. Spleen: Not enlarged. No focal lesion. No laceration, subcapsular hematoma, or  vascular injury. Adrenals/Urinary Tract: No nodularity bilaterally. Bilateral kidneys enhance symmetrically. No hydronephrosis. No contusion, laceration, or subcapsular hematoma. No injury to the vascular structures or collecting systems. No hydroureter. Limited evaluation during bladder lumen due to streak artifact originating from femoral surgical hardware. On delayed imaging, there is no urothelial wall thickening and there are no filling defects in the opacified portions of the bilateral collecting systems or ureters. Stomach/Bowel: No small or large bowel wall thickening or dilatation. The appendix is unremarkable. Vasculature/Lymphatics: Severe atherosclerotic plaque. No abdominal aorta or iliac aneurysm. No active contrast extravasation or pseudoaneurysm. No abdominal, pelvic, inguinal lymphadenopathy. Reproductive: Normal. Other: No simple free fluid ascites. No pneumoperitoneum. No hemoperitoneum. No mesenteric hematoma  identified. No organized fluid collection. Musculoskeletal: No significant soft tissue hematoma. No acute pelvic fracture. No spinal fracture. Stable grade 1 anterolisthesis of L4 on L5. Mild retrolisthesis of L1 on L2, L2 on L3, L3 on L4. Right proximal femoral surgical hardware. No right hip dislocation. Total left hip arthroplasty. L1-S1 posterolateral and interbody as well as left sacroiliac joint surgical hardware. No CT findings of surgical hardware complication. Ports and Devices: None. IMPRESSION: 1. Acute comminuted and displaced proximal right clavicular fracture. Associated right pectoralis and sternoclavicular (large) musculature hematoma. No mass effect on the trachea noted. 2. No acute intrathoracic, intra-abdominal, intrapelvic traumatic injury. 3. No acute fracture or traumatic malalignment of the thoracic or lumbar spine. 4. Aneurysmal ascending thoracic aorta (4 cm). Recommend annual imaging followup by CTA or MRA. This recommendation follows 2010  ACCF/AHA/AATS/ACR/ASA/SCA/SCAI/SIR/STS/SVM Guidelines for the Diagnosis and Management of Patients with Thoracic Aortic Disease. Circulation. 2010; 121: X914-N829. Aortic aneurysm NOS (ICD10-I71.9). 5.  Aortic Atherosclerosis (ICD10-I70.0). 6.  Emphysema (ICD10-J43.9)-trace. Electronically Signed   By: Tish Frederickson M.D.   On: 03/28/2023 01:58   DG Pelvis Portable Result Date: 03/28/2023 CLINICAL DATA:  Trauma EXAM: PORTABLE PELVIS 1-2 VIEWS COMPARISON:  CT abdomen pelvis 03/28/2023. FINDINGS: Right proximal femoral surgical hardware. No right hip dislocation. No right hip fracture. Total left hip arthroplasty. No radiographic findings to suggest surgical hardware complication. There is no evidence of pelvic fracture or diastasis. No pelvic bone lesions are seen. Lumbosacral and left sacroiliac joint surgical hardware noted. Vascular calcification. IMPRESSION: Negative for acute traumatic injury. Electronically Signed   By: Tish Frederickson M.D.   On: 03/28/2023 01:21   DG Knee Complete 4 Views Right Result Date: 03/28/2023 CLINICAL DATA:  fall EXAM: RIGHT KNEE - COMPLETE 4+ VIEW COMPARISON:  None Available. FINDINGS: Chondrocalcinosis. No evidence of fracture, dislocation, or joint effusion. Mild tricompartmental degenerative changes of the knee. Partially visualized old healed proximal mid fibular shaft fracture. Soft tissues are unremarkable. Vascular calcifications. IMPRESSION: No acute displaced fracture or dislocation. Electronically Signed   By: Tish Frederickson M.D.   On: 03/28/2023 01:18   DG Clavicle Right Result Date: 03/28/2023 CLINICAL DATA:  fall EXAM: RIGHT CLAVICLE - 2+ VIEWS COMPARISON:  None Available. FINDINGS: Acute comminuted and inferiorly displaced proximal right clavicular fracture. Old healed mid to distal right clavicular fracture. Acromioclavicular joint degenerative changes. Glenohumeral joint degenerative changes. Soft tissues are unremarkable. IMPRESSION: Acute comminuted  and inferiorly displaced proximal right clavicular fracture. Electronically Signed   By: Tish Frederickson M.D.   On: 03/28/2023 01:16   DG Chest Port 1 View Result Date: 03/28/2023 CLINICAL DATA:  Trauma EXAM: PORTABLE CHEST 1 VIEW COMPARISON:  Cardiac pet CT 02/23/2023, chest x-ray 07/24/2015 FINDINGS: The heart and mediastinal contours are within normal limits. Atherosclerotic plaque. Low lung volumes with question of left lower lobe airspace opacity. No pulmonary edema. No pleural effusion. No pneumothorax. No acute osseous abnormality. IMPRESSION: 1. Low lung volumes with question of left lower lobe airspace opacity. Recommend repeat chest x-ray PA and lateral view further evaluation. 2.  Aortic Atherosclerosis (ICD10-I70.0). Electronically Signed   By: Tish Frederickson M.D.   On: 03/28/2023 00:45    Pending Labs Unresulted Labs (From admission, onward)     Start     Ordered   03/29/23 0500  CBC  Tomorrow morning,   R        03/28/23 0623   03/29/23 0500  Basic metabolic panel  Tomorrow morning,   R  03/28/23 0623   03/28/23 0010  Urinalysis, Routine w reflex microscopic -Urine, Clean Catch  [facility] ED TRAUMA PANEL [facility])  Once,   URGENT       Question:  Specimen Source  Answer:  Urine, Clean Catch   03/28/23 0009            Vitals/Pain Today's Vitals   03/28/23 0300 03/28/23 0315 03/28/23 0330 03/28/23 0340  BP: 112/64 106/69 109/66   Pulse: 66 67 67   Resp: 15 12 15    Temp:    98.1 F (36.7 C)  TempSrc:    Oral  SpO2: 97% 96% 99%   Weight:      Height:      PainSc:        Isolation Precautions No active isolations  Medications Medications  acetaminophen (TYLENOL) tablet 1,000 mg (has no administration in time range)  oxyCODONE (Oxy IR/ROXICODONE) immediate release tablet 2.5-5 mg (has no administration in time range)  methocarbamol (ROBAXIN) tablet 500 mg (has no administration in time range)    Or  methocarbamol (ROBAXIN) injection 500 mg (has no  administration in time range)  docusate sodium (COLACE) capsule 100 mg (has no administration in time range)  polyethylene glycol (MIRALAX / GLYCOLAX) packet 17 g (has no administration in time range)  ondansetron (ZOFRAN-ODT) disintegrating tablet 4 mg (has no administration in time range)    Or  ondansetron (ZOFRAN) injection 4 mg (has no administration in time range)  metoprolol tartrate (LOPRESSOR) injection 5 mg (has no administration in time range)  hydrALAZINE (APRESOLINE) injection 10 mg (has no administration in time range)  morphine (PF) 4 MG/ML injection 4 mg (4 mg Intravenous Given 03/28/23 0026)  iohexol (OMNIPAQUE) 350 MG/ML injection 75 mL (75 mLs Intravenous Contrast Given 03/28/23 0116)    Mobility walks with device     Focused Assessments     R Recommendations: See Admitting Provider Note  Report given to:   Additional Notes:

## 2023-03-28 NOTE — Progress Notes (Signed)
Patient to ED after a fall causing a clavicle fx/hematoma to right chest/neck. Skin marker used to designate margins. ED RN aware to call if hematoma expands past the marked area.

## 2023-03-28 NOTE — Care Management Obs Status (Signed)
MEDICARE OBSERVATION STATUS NOTIFICATION   Patient Details  Name: Larry Holloway MRN: [id number] Date of Birth: 05-18-1950   Medicare Observation Status Notification Given:  Yes    Lockie Pares, RN 03/28/2023, 2:47 PM

## 2023-03-28 NOTE — ED Notes (Signed)
Pt transported to CT ?

## 2023-03-28 NOTE — H&P (Signed)
Reason for Consult/Chief Complaint: clavicle fracture, hematoma Consultant: Pilar Plate, MD  Larry Holloway is an 72 y.o. male.   HPI: 81M mechanical fall down stairs. R clavicle fracture with associated hematoma. R knee swelling, negative for fracture. H/o stroke in 01/2023, slated for BCUS as MRI during that admission significant for 70% LCA stenosis. Patient reports taking plavix, ASA325, and ASA81 at home.    Past Medical History:  Diagnosis Date   Arthritis    Back   Back pain    Cancer of prostate (HCC)    Carotid artery occlusion    Central serous retinopathy    High blood pressure    High cholesterol    Mild depression    Neck pain    PONV (postoperative nausea and vomiting)    Pre-diabetes    Right arm pain     Past Surgical History:  Procedure Laterality Date   ANTERIOR LAT LUMBAR FUSION N/A 08/10/2022   Procedure: Lumbar One-Two, Lumbar Two-Three, Lumbar Three-Four Anterior Lateral Lumbar Fusion;  Surgeon: Barnett Abu, MD;  Location: [facility] OR;  Service: Neurosurgery;  Laterality: N/A;   CATARACT EXTRACTION W/ INTRAOCULAR LENS IMPLANT Bilateral 2022   COLONOSCOPY  2023   FRACTURE SURGERY Right 1975   Right Leg   HIP SURGERY Right 2002   Ball and Socket repaired   NECK SURGERY  2014   Cadaver disc placed in neck   PROSTATE SURGERY     2007    Family History  Problem Relation Age of Onset   Thyroid disease Mother    Heart disease Mother    Cancer Mother    High blood pressure Mother    Colon polyps Mother    Cancer - Lung Mother    Colon polyps Father    CAD Father    Cancer - Prostate Father    High blood pressure Father    Heart disease Father    COPD Father    CVA Father    Cancer - Prostate Brother     Social History:  reports that he quit smoking about 30 years ago. His smoking use included cigarettes. He has never used smokeless tobacco. He reports that he does not drink alcohol and does not use drugs.  Allergies:  Allergies  Allergen  Reactions   Ace Inhibitors Cough   Corticosteroids     Other Reaction(s): Central seroud retinopathy   Tape Swelling   Wound Dressing Adhesive Swelling    Medications: I have reviewed the patient's current medications.  Results for orders placed or performed during the hospital encounter of 03/27/23 (from the past 48 hours)  Comprehensive metabolic panel     Status: Abnormal   Collection Time: 03/28/23 12:10 AM  Result Value Ref Range   Sodium 136 135 - 145 mmol/L   Potassium 4.2 3.5 - 5.1 mmol/L   Chloride 102 98 - 111 mmol/L   CO2 24 22 - 32 mmol/L   Glucose, Bld 163 (H) 70 - 99 mg/dL    Comment: Glucose reference range applies only to samples taken after fasting for at least 8 hours.   BUN 15 8 - 23 mg/dL   Creatinine, Ser 4.27 0.61 - 1.24 mg/dL   Calcium 8.9 8.9 - 06.2 mg/dL   Total Protein 6.4 (L) 6.5 - 8.1 g/dL   Albumin 3.7 3.5 - 5.0 g/dL   AST 28 15 - 41 U/L   ALT 26 0 - 44 U/L   Alkaline Phosphatase 67 38 - 126  U/L   Total Bilirubin 0.4 <1.2 mg/dL   GFR, Estimated >29 >52 mL/min    Comment: (NOTE) Calculated using the CKD-EPI Creatinine Equation (2021)    Anion gap 10 5 - 15    Comment: Performed at [facility], [address], [place], [place] [address]  CBC     Status: Abnormal   Collection Time: 03/28/23 12:10 AM  Result Value Ref Range   WBC 7.2 4.0 - 10.5 K/uL   RBC 4.13 (L) 4.22 - 5.81 MIL/uL   Hemoglobin 12.4 (L) 13.0 - 17.0 g/dL   HCT 44.0 (L) 10.2 - 72.5 %   MCV 90.6 80.0 - 100.0 fL   MCH 30.0 26.0 - 34.0 pg   MCHC 33.2 30.0 - 36.0 g/dL   RDW 36.6 44.0 - 34.7 %   Platelets 215 150 - 400 K/uL   nRBC 0.0 0.0 - 0.2 %    Comment: Performed at [facility], [address] [place], [place] [address]  Ethanol     Status: None   Collection Time: 03/28/23 12:10 AM  Result Value Ref Range   Alcohol, Ethyl (B) <10 <10 mg/dL    Comment: (NOTE) Lowest detectable limit for serum alcohol is 10 mg/dL.  For medical purposes only. Performed at  [facility], [address] [place], [place] [address]   Protime-INR     Status: None   Collection Time: 03/28/23 12:10 AM  Result Value Ref Range   Prothrombin Time 13.3 11.4 - 15.2 seconds   INR 1.0 0.8 - 1.2    Comment: (NOTE) INR goal varies based on device and disease states. Performed at [facility], [address], [place], [place] [address]   Sample to Blood Bank     Status: None   Collection Time: 03/28/23 12:10 AM  Result Value Ref Range   Blood Bank Specimen SAMPLE AVAILABLE FOR TESTING    Sample Expiration      03/31/2023,2359 Performed at [facility], [address], [place], [place] [address]   I-Stat Chem 8, ED     Status: Abnormal   Collection Time: 03/28/23 12:15 AM  Result Value Ref Range   Sodium 138 135 - 145 mmol/L   Potassium 4.0 3.5 - 5.1 mmol/L   Chloride 100 98 - 111 mmol/L   BUN 16 8 - 23 mg/dL   Creatinine, Ser 4.16 0.61 - 1.24 mg/dL   Glucose, Bld 606 (H) 70 - 99 mg/dL    Comment: Glucose reference range applies only to samples taken after fasting for at least 8 hours.   Calcium, Ion 1.15 1.15 - 1.40 mmol/L   TCO2 26 22 - 32 mmol/L   Hemoglobin 12.6 (L) 13.0 - 17.0 g/dL   HCT 30.1 (L) 60.1 - 09.3 %  I-Stat Lactic Acid, ED     Status: None   Collection Time: 03/28/23 12:19 AM  Result Value Ref Range   Lactic Acid, Venous 1.2 0.5 - 1.9 mmol/L    CT HEAD WO CONTRAST ( ) Result Date: 03/28/2023 CLINICAL DATA:  Neck trauma (Age >= 65y); Head trauma, moderate-severe EXAM: CT HEAD WITHOUT CONTRAST CT CERVICAL SPINE WITHOUT CONTRAST TECHNIQUE: Multidetector CT imaging of the head and cervical spine was performed following the standard protocol without intravenous contrast. Multiplanar CT image reconstructions of the cervical spine were also generated. RADIATION DOSE REDUCTION: This exam was performed according to the departmental dose-optimization program which includes automated exposure control, adjustment of the  mA and/or kV  according to patient size and/or use of iterative reconstruction technique. COMPARISON:  CT head 02/04/2023. FINDINGS: CT HEAD FINDINGS Brain: No evidence of large-territorial acute infarction. No parenchymal hemorrhage. No mass lesion. No extra-axial collection. No mass effect or midline shift. No hydrocephalus. Basilar cisterns are patent. Vascular: No hyperdense vessel. Atherosclerotic calcifications are present within the cavernous internal carotid and vertebral arteries. Skull: No acute fracture or focal lesion. Sinuses/Orbits: Bilateral maxillary sinus mucosal thickening. Bilateral ethmoid mucosal thickening. Paranasal sinuses and mastoid air cells are clear. The orbits are unremarkable. Other: None. CT CERVICAL SPINE FINDINGS Alignment: Grade 1 anterolisthesis of C4 on C5. Skull base and vertebrae: C5 - C7 anterior cervical discectomy and fusion. Multilevel severe degenerative changes spine most prominent at the C3-C4 levels. Associated severe osseous neural foraminal stenosis at the bilateral C3-C4 and C4-C5 levels. No acute fracture. No aggressive appearing focal osseous lesion or focal pathologic process. Soft tissues and spinal canal: No prevertebral fluid or swelling. No visible canal hematoma. Upper chest: Unremarkable. Other: Please see separately dictated CT chest 03/28/2023 regarding sternocleidomastoid hemorrhage in clavicular fracture. Severe atherosclerotic plaque of the carotid arteries within the neck. IMPRESSION: 1. No acute intracranial abnormality. 2. No acute displaced fracture or traumatic listhesis of the cervical spine. 3. Bilateral C3-C4 and C4-C5 osseous neuroforaminal stenosis. 4. C5 - C7 anterior cervical discectomy and fusion. 5. Please see separately dictated CT chest 03/28/2023 regarding sternocleidomastoid hemorrhage in clavicular fracture. 6. Severe atherosclerotic plaque of the carotid arteries within the neck. Electronically Signed   By: Tish Frederickson M.D.   On: 03/28/2023  02:08   CT CERVICAL SPINE WO CONTRAST Result Date: 03/28/2023 CLINICAL DATA:  Neck trauma (Age >= 65y); Head trauma, moderate-severe EXAM: CT HEAD WITHOUT CONTRAST CT CERVICAL SPINE WITHOUT CONTRAST TECHNIQUE: Multidetector CT imaging of the head and cervical spine was performed following the standard protocol without intravenous contrast. Multiplanar CT image reconstructions of the cervical spine were also generated. RADIATION DOSE REDUCTION: This exam was performed according to the departmental dose-optimization program which includes automated exposure control, adjustment of the mA and/or kV according to patient size and/or use of iterative reconstruction technique. COMPARISON:  CT head 02/04/2023. FINDINGS: CT HEAD FINDINGS Brain: No evidence of large-territorial acute infarction. No parenchymal hemorrhage. No mass lesion. No extra-axial collection. No mass effect or midline shift. No hydrocephalus. Basilar cisterns are patent. Vascular: No hyperdense vessel. Atherosclerotic calcifications are present within the cavernous internal carotid and vertebral arteries. Skull: No acute fracture or focal lesion. Sinuses/Orbits: Bilateral maxillary sinus mucosal thickening. Bilateral ethmoid mucosal thickening. Paranasal sinuses and mastoid air cells are clear. The orbits are unremarkable. Other: None. CT CERVICAL SPINE FINDINGS Alignment: Grade 1 anterolisthesis of C4 on C5. Skull base and vertebrae: C5 - C7 anterior cervical discectomy and fusion. Multilevel severe degenerative changes spine most prominent at the C3-C4 levels. Associated severe osseous neural foraminal stenosis at the bilateral C3-C4 and C4-C5 levels. No acute fracture. No aggressive appearing focal osseous lesion or focal pathologic process. Soft tissues and spinal canal: No prevertebral fluid or swelling. No visible canal hematoma. Upper chest: Unremarkable. Other: Please see separately dictated CT chest 03/28/2023 regarding sternocleidomastoid  hemorrhage in clavicular fracture. Severe atherosclerotic plaque of the carotid arteries within the neck. IMPRESSION: 1. No acute intracranial abnormality. 2. No acute displaced fracture or traumatic listhesis of the cervical spine. 3. Bilateral C3-C4 and C4-C5 osseous neuroforaminal stenosis. 4. C5 - C7 anterior cervical discectomy and fusion. 5. Please see separately dictated CT chest 03/28/2023  regarding sternocleidomastoid hemorrhage in clavicular fracture. 6. Severe atherosclerotic plaque of the carotid arteries within the neck. Electronically Signed   By: Tish Frederickson M.D.   On: 03/28/2023 02:08   CT CHEST ABDOMEN PELVIS W CONTRAST Result Date: 03/28/2023 CLINICAL DATA:  Polytrauma, blunt.  Fall EXAM: CT CHEST, ABDOMEN, AND PELVIS WITH CONTRAST TECHNIQUE: Multidetector CT imaging of the chest, abdomen and pelvis was performed following the standard protocol during bolus administration of intravenous contrast. RADIATION DOSE REDUCTION: This exam was performed according to the departmental dose-optimization program which includes automated exposure control, adjustment of the mA and/or kV according to patient size and/or use of iterative reconstruction technique. CONTRAST:  75mL OMNIPAQUE IOHEXOL 350 MG/ML SOLN COMPARISON:  PET CT cardiac 02/23/2023, x-ray lumbar spine 08/15/2022, x-ray lumbar spine 09/25/2022 FINDINGS: CHEST: Cardiovascular: No aortic injury. The ascending thoracic aorta measures at the upper limits of normal (4 cm). The aortic root measures at the upper limits of normal (4 cm). The descending thoracic aorta is normal in caliber. The heart is normal in size. No significant pericardial effusion. Mild atherosclerotic plaque. Four-vessel coronary calcification. Mediastinum/Nodes: No pneumomediastinum. No mediastinal hematoma. The esophagus is unremarkable. The thyroid is unremarkable. The central airways are patent. No mediastinal, hilar, or axillary lymphadenopathy. Lungs/Pleura: Bilateral  lower lobe atelectasis. Mild paraseptal emphysematous changes. No focal consolidation. No pulmonary nodule. No pulmonary mass. No pulmonary contusion or laceration. No pneumatocele formation. No pleural effusion. No pneumothorax. No hemothorax. Musculoskeletal/Chest wall: No chest wall mass. Acute comminuted and displaced proximal right clavicular fracture. Associated right pectoralis and sternoclavicular asymmetric enlargement as well as heterogeneity with surrounding fat stranding. Old healed right mid to distal clavicular fracture. Moderate degenerative changes of the right shoulder. Mild degenerative changes left shoulder. No acute rib or sternal fracture. No spinal fracture. ABDOMEN / PELVIS: Hepatobiliary: Not enlarged. No focal lesion. No laceration or subcapsular hematoma. The gallbladder is otherwise unremarkable with no radio-opaque gallstones. No biliary ductal dilatation. Pancreas: Normal pancreatic contour. No main pancreatic duct dilatation. Spleen: Not enlarged. No focal lesion. No laceration, subcapsular hematoma, or vascular injury. Adrenals/Urinary Tract: No nodularity bilaterally. Bilateral kidneys enhance symmetrically. No hydronephrosis. No contusion, laceration, or subcapsular hematoma. No injury to the vascular structures or collecting systems. No hydroureter. Limited evaluation during bladder lumen due to streak artifact originating from femoral surgical hardware. On delayed imaging, there is no urothelial wall thickening and there are no filling defects in the opacified portions of the bilateral collecting systems or ureters. Stomach/Bowel: No small or large bowel wall thickening or dilatation. The appendix is unremarkable. Vasculature/Lymphatics: Severe atherosclerotic plaque. No abdominal aorta or iliac aneurysm. No active contrast extravasation or pseudoaneurysm. No abdominal, pelvic, inguinal lymphadenopathy. Reproductive: Normal. Other: No simple free fluid ascites. No pneumoperitoneum.  No hemoperitoneum. No mesenteric hematoma identified. No organized fluid collection. Musculoskeletal: No significant soft tissue hematoma. No acute pelvic fracture. No spinal fracture. Stable grade 1 anterolisthesis of L4 on L5. Mild retrolisthesis of L1 on L2, L2 on L3, L3 on L4. Right proximal femoral surgical hardware. No right hip dislocation. Total left hip arthroplasty. L1-S1 posterolateral and interbody as well as left sacroiliac joint surgical hardware. No CT findings of surgical hardware complication. Ports and Devices: None. IMPRESSION: 1. Acute comminuted and displaced proximal right clavicular fracture. Associated right pectoralis and sternoclavicular (large) musculature hematoma. No mass effect on the trachea noted. 2. No acute intrathoracic, intra-abdominal, intrapelvic traumatic injury. 3. No acute fracture or traumatic malalignment of the thoracic or lumbar spine. 4. 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 Recommend annual imaging followup by CTA or MRA. This recommendation follows 2010 ACCF/AHA/AATS/ACR/ASA/SCA/SCAI/SIR/STS/SVM Guidelines for the Diagnosis and Management of Patients with Thoracic Aortic Disease. Circulation. 2010; 121: Z610-R604. Aortic aneurysm NOS (ICD10-I71.9). 5.  Aortic Atherosclerosis (ICD10-I70.0). 6.  Emphysema (ICD10-J43.9)-trace. Electronically Signed   By: Tish Frederickson M.D.   On: 03/28/2023 01:58   DG Pelvis Portable Result Date: 03/28/2023 CLINICAL DATA:  Trauma EXAM: PORTABLE PELVIS 1-2 VIEWS COMPARISON:  CT abdomen pelvis 03/28/2023. FINDINGS: Right proximal femoral surgical hardware. No right hip dislocation. No right hip fracture. Total left hip arthroplasty. No radiographic findings to suggest surgical hardware complication. There is no evidence of pelvic fracture or diastasis. No pelvic bone lesions are seen. Lumbosacral and left sacroiliac joint surgical hardware noted. Vascular calcification. IMPRESSION: Negative for acute traumatic injury.  Electronically Signed   By: Tish Frederickson M.D.   On: 03/28/2023 01:21   DG Knee Complete 4 Views Right Result Date: 03/28/2023 CLINICAL DATA:  fall EXAM: RIGHT KNEE - COMPLETE 4+ VIEW COMPARISON:  None Available. FINDINGS: Chondrocalcinosis. No evidence of fracture, dislocation, or joint effusion. Mild tricompartmental degenerative changes of the knee. Partially visualized old healed proximal mid fibular shaft fracture. Soft tissues are unremarkable. Vascular calcifications. IMPRESSION: No acute displaced fracture or dislocation. Electronically Signed   By: Tish Frederickson M.D.   On: 03/28/2023 01:18   DG Clavicle Right Result Date: 03/28/2023 CLINICAL DATA:  fall EXAM: RIGHT CLAVICLE - 2+ VIEWS COMPARISON:  None Available. FINDINGS: Acute comminuted and inferiorly displaced proximal right clavicular fracture. Old healed mid to distal right clavicular fracture. Acromioclavicular joint degenerative changes. Glenohumeral joint degenerative changes. Soft tissues are unremarkable. IMPRESSION: Acute comminuted and inferiorly displaced proximal right clavicular fracture. Electronically Signed   By: Tish Frederickson M.D.   On: 03/28/2023 01:16   DG Chest Port 1 View Result Date: 03/28/2023 CLINICAL DATA:  Trauma EXAM: PORTABLE CHEST 1 VIEW COMPARISON:  Cardiac pet CT 02/23/2023, chest x-ray 07/24/2015 FINDINGS: The heart and mediastinal contours are within normal limits. Atherosclerotic plaque. Low lung volumes with question of left lower lobe airspace opacity. No pulmonary edema. No pleural effusion. No pneumothorax. No acute osseous abnormality. IMPRESSION: 1. Low lung volumes with question of left lower lobe airspace opacity. Recommend repeat chest x-ray PA and lateral view further evaluation. 2.  Aortic Atherosclerosis (ICD10-I70.0). Electronically Signed   By: Tish Frederickson M.D.   On: 03/28/2023 00:45    ROS 10 point review of systems is negative except as listed above in HPI.   Physical  Exam Blood pressure 109/66, pulse 67, temperature 98.2 F (36.8 C), temperature source Oral, resp. rate 15, height 5' 10.5" (1.791 m), weight 86.2 kg, SpO2 99%. Constitutional: well-developed, well-nourished HEENT: pupils equal, round, reactive to light, 2mm b/l, moist conjunctiva, external inspection of ears and nose normal, hearing intact, abrasion to R scalp Oropharynx: normal oropharyngeal mucosa, normal dentition Neck: no thyromegaly, trachea midline, no midline cervical tenderness to palpation, R neck hematoma Chest: breath sounds equal bilaterally, normal respiratory effort, large hematoma of R clavicle with extension up into the R neck Abdomen: soft, NT, no bruising, no hepatosplenomegaly Back: abrasions x2 to R posterior shoulder Extremities: 2+ radial and pedal pulses bilaterally, intact motor and sensation bilateral UE and LE, no peripheral edema, bruising to R knee MSK: unable to assess gait/station, no clubbing/cyanosis of fingers/toes Skin: warm, dry, no rashes Psych: normal memory, normal mood/affect     Assessment/Plan: Mechanical fall down stairs  R clavicle fx - ortho c/s, Dr.  Wham  R chest wall and neck hematoma - monitor clinically for enlargement, will mark the edges of the hematoma this AM H/o stroke on DAPT - suspect he should not be taking both doses of ASA, slated for BCUS early Jan. Suspect visibility on the R will be compromised due to neck hematoma. Notified [facility] for determination of change to unilateral [place], delay of BUS, or modality change to CT. Await final recs, but this is a non-urgent decision and the decision does not need to be made prior to discharge. Hold DAPT for now, joint decision with [facility] regarding duration to hold to be made at time of discharge.  FEN - regular diet DVT - SCDs, hold chemical ppx due to bleeding concerns Dispo - 4NP    Diamantina Monks, MD General and Trauma Surgery [facility]

## 2023-03-29 DIAGNOSIS — S42024A Nondisplaced fracture of shaft of right clavicle, initial encounter for closed fracture: Secondary | ICD-10-CM | POA: Diagnosis not present

## 2023-03-29 DIAGNOSIS — S20211A Contusion of right front wall of thorax, initial encounter: Secondary | ICD-10-CM | POA: Diagnosis not present

## 2023-03-29 LAB — CBC
HCT: 32.8 % — ABNORMAL LOW (ref 39.0–52.0)
HCT: 34.5 % — ABNORMAL LOW (ref 39.0–52.0)
Hemoglobin: 10.7 g/dL — ABNORMAL LOW (ref 13.0–17.0)
Hemoglobin: 11.3 g/dL — ABNORMAL LOW (ref 13.0–17.0)
MCH: 30.1 pg (ref 26.0–34.0)
MCH: 30.2 pg (ref 26.0–34.0)
MCHC: 32.6 g/dL (ref 30.0–36.0)
MCHC: 32.8 g/dL (ref 30.0–36.0)
MCV: 92.1 fL (ref 80.0–100.0)
MCV: 92.2 fL (ref 80.0–100.0)
Platelets: 168 10*3/uL (ref 150–400)
Platelets: 204 10*3/uL (ref 150–400)
RBC: 3.56 MIL/uL — ABNORMAL LOW (ref 4.22–5.81)
RBC: 3.74 MIL/uL — ABNORMAL LOW (ref 4.22–5.81)
RDW: 15.5 % (ref 11.5–15.5)
RDW: 15.3 % (ref 11.5–15.5)
WBC: 6.5 10*3/uL (ref 4.0–10.5)
WBC: 6.6 10*3/uL (ref 4.0–10.5)
nRBC: 0 % (ref 0.0–0.2)
nRBC: 0 % (ref 0.0–0.2)

## 2023-03-29 LAB — BASIC METABOLIC PANEL
Anion gap: 8 (ref 5–15)
BUN: 17 mg/dL (ref 8–23)
CO2: 26 mmol/L (ref 22–32)
Calcium: 8.6 mg/dL — ABNORMAL LOW (ref 8.9–10.3)
Chloride: 102 mmol/L (ref 98–111)
Creatinine, Ser: 0.94 mg/dL (ref 0.61–1.24)
GFR, Estimated: >60 mL/min (ref >60)
Glucose, Bld: 109 mg/dL — ABNORMAL HIGH (ref 70–99)
Potassium: 4.1 mmol/L (ref 3.5–5.1)
Sodium: 136 mmol/L (ref 135–145)

## 2023-03-29 MED ORDER — OXYCODONE HCL 5 MG PO TABS: 5.0000 mg | ORAL_TABLET | Freq: Four times a day (QID) | ORAL | 0 refills | Status: AC | PRN

## 2023-03-29 MED ORDER — DOCUSATE SODIUM 100 MG PO CAPS: 100.0000 mg | ORAL_CAPSULE | Freq: Two times a day (BID) | ORAL | Status: DC

## 2023-03-29 MED ORDER — METHOCARBAMOL 500 MG PO TABS: 500.0000 mg | ORAL_TABLET | Freq: Four times a day (QID) | ORAL | 0 refills | Status: DC | PRN

## 2023-03-29 MED ORDER — ACETAMINOPHEN 500 MG PO TABS: 1000.0000 mg | ORAL_TABLET | Freq: Four times a day (QID) | ORAL | Status: AC

## 2023-03-29 NOTE — Progress Notes (Signed)
  Progress Note    03/29/2023 11:24 AM   Subjective:    Feeling okay today  Vitals:   03/29/23 0900 03/29/23 1000  BP: (!) 143/80 134/80  Pulse: 84 74  Resp: 17 16  Temp:    SpO2: 98% 97%    Physical Exam: Awake alert oriented Nonlabored respirations Very large right supraclavicular hematoma  CBC    Component Value Date/Time   WBC 6.5 03/29/2023 0453   RBC 3.56 (L) 03/29/2023 0453   HGB 10.7 (L) 03/29/2023 0453   HCT 32.8 (L) 03/29/2023 0453   PLT 168 03/29/2023 0453   MCV 92.1 03/29/2023 0453   MCH 30.1 03/29/2023 0453   MCHC 32.6 03/29/2023 0453   RDW 15.5 03/29/2023 0453   LYMPHSABS 1.8 02/04/2023 1509   MONOABS 0.4 02/04/2023 1509   EOSABS 0.0 02/04/2023 1509   BASOSABS 0.0 02/04/2023 1509    BMET    Component Value Date/Time   NA 136 03/29/2023 0453   K 4.1 03/29/2023 0453   CL 102 03/29/2023 0453   CO2 26 03/29/2023 0453   GLUCOSE 109 (H) 03/29/2023 0453   BUN 17 03/29/2023 0453   CREATININE 0.94 03/29/2023 0453   CALCIUM 8.6 (L) 03/29/2023 0453   GFRNONAA >60 03/29/2023 0453    INR    Component Value Date/Time   INR 1.0 03/28/2023 0010    No intake or output data in the 24 hours ending 03/29/23 1124   Assessment:  73 y.o. male is initially evaluated for left visual changes with MRA that demonstrated 70% stenosis now here with mechanical fall resulting in clavicular fracture with very large hematoma likely secondary to dual antiplatelet therapy.  CTA was performed demonstrates approximately 75% heavily calcified stenosis at the common carotid artery bifurcation.  Appears that this remains asymptomatic.  Given large hematoma I have recommended single antiplatelet with aspirin for the time being and he will follow-up with me in just over 1 week with his previously planned carotid duplex to evaluate for stenosis of the left common into the internal carotid artery where there appears there is poststenotic dilatation.  Certainly we may need to  consider intervention which would likely require carotid endarterectomy given the heavy calcium burden.   At follow-up we will likely only be able to tolerate left-sided duplex given right-sided hematoma.    We again discussed the signs and symptoms of stroke and he demonstrates good understanding.  Bethzy Hauck C. Randie Heinz, MD [facility] Office: [phone] Pager: [phone]  03/29/2023 11:24 AM

## 2023-03-29 NOTE — ED Notes (Signed)
ED TO INPATIENT HANDOFF REPORT  ED Nurse Name and Phone #: Theophilus Bones [phone]  S Name/Age/Gender Larry Holloway 72 y.o. male Room/Bed: 010C/010C  Code Status   Code Status: Full Code  Home/SNF/Other Home Patient oriented to: self, place, time, and situation Is this baseline? Yes   Triage Complete: Triage complete  Chief Complaint Hematoma of neck [S10.93XA]  Triage Note Pt reports he fell tonight when walking down the steps, approx 12-13 steps. + head injury. He denies LOC but he does take Plavix. Last dose this morning. He has abrasions to the top of his head. He is reporting right shoulder pain, visible swelling and deformity. Swelling and abrasions noted to right knee as well.   Allergies Allergies  Allergen Reactions   Ace Inhibitors Cough   Corticosteroids     Other Reaction(s): Central seroud retinopathy   Tape Swelling   Wound Dressing Adhesive Swelling    Level of Care/Admitting Diagnosis ED Disposition     ED Disposition  Admit   Condition  --   Comment  Hospital Area: [facility] [[id number]]  Level of Care: Progressive [102]  Admit to Progressive based on following criteria: RESPIRATORY PROBLEMS hypoxemic/hypercapnic respiratory failure that is responsive to NIPPV (BiPAP) or High Flow Nasal Cannula (6-80 lpm). Frequent assessment/intervention, no > Q2 hrs < Q4 hrs, to maintain oxygenation and pulmonary hygiene.  May place patient in observation at [facility] or [facility] if equivalent level of care is available:: No  Covid Evaluation: Asymptomatic - no recent exposure (last 10 days) testing not required  Diagnosis: Hematoma of neck [191478]  Admitting Physician: TRAUMA MD [[id number]]  Attending Physician: TRAUMA MD [[id number]]  For patients discharging to extended facilities (i.e. SNF, AL, group homes or LTAC) initiate:: Discharge to SNF/Facility Placement COVID-19 Lab Testing Protocol          B Medical/Surgery History Past Medical  History:  Diagnosis Date   Arthritis    Back   Back pain    Cancer of prostate (HCC)    Carotid artery occlusion    Central serous retinopathy    High blood pressure    High cholesterol    Mild depression    Neck pain    PONV (postoperative nausea and vomiting)    Pre-diabetes    Right arm pain    Past Surgical History:  Procedure Laterality Date   ANTERIOR LAT LUMBAR FUSION N/A 08/10/2022   Procedure: Lumbar One-Two, Lumbar Two-Three, Lumbar Three-Four Anterior Lateral Lumbar Fusion;  Surgeon: Barnett Abu, MD;  Location: [facility] OR;  Service: Neurosurgery;  Laterality: N/A;   CATARACT EXTRACTION W/ INTRAOCULAR LENS IMPLANT Bilateral 2022   COLONOSCOPY  2023   FRACTURE SURGERY Right 1975   Right Leg   HIP SURGERY Right 2002   Ball and Socket repaired   NECK SURGERY  2014   Cadaver disc placed in neck   PROSTATE SURGERY     2007     A IV Location/Drains/Wounds Patient Lines/Drains/Airways Status     Active Line/Drains/Airways     Name Placement date Placement time Site Days   Peripheral IV 03/28/23 20 G Left Wrist 03/28/23  0007  Wrist  1            Intake/Output Last 24 hours No intake or output data in the 24 hours ending 03/29/23 0707  Labs/Imaging Results for orders placed or performed during the hospital encounter of 03/27/23 (from the past 48 hours)  Comprehensive metabolic panel  Status: Abnormal   Collection Time: 03/28/23 12:10 AM  Result Value Ref Range   Sodium 136 135 - 145 mmol/L   Potassium 4.2 3.5 - 5.1 mmol/L   Chloride 102 98 - 111 mmol/L   CO2 24 22 - 32 mmol/L   Glucose, Bld 163 (H) 70 - 99 mg/dL    Comment: Glucose reference range applies only to samples taken after fasting for at least 8 hours.   BUN 15 8 - 23 mg/dL   Creatinine, Ser 0.98 0.61 - 1.24 mg/dL   Calcium 8.9 8.9 - 11.9 mg/dL   Total Protein 6.4 (L) 6.5 - 8.1 g/dL   Albumin 3.7 3.5 - 5.0 g/dL   AST 28 15 - 41 U/L   ALT 26 0 - 44 U/L   Alkaline Phosphatase 67 38 - 126  U/L   Total Bilirubin 0.4 <1.2 mg/dL   GFR, Estimated >14 >78 mL/min    Comment: (NOTE) Calculated using the CKD-EPI Creatinine Equation (2021)    Anion gap 10 5 - 15    Comment: Performed at [facility], [address], [place], [place] [address]  CBC     Status: Abnormal   Collection Time: 03/28/23 12:10 AM  Result Value Ref Range   WBC 7.2 4.0 - 10.5 K/uL   RBC 4.13 (L) 4.22 - 5.81 MIL/uL   Hemoglobin 12.4 (L) 13.0 - 17.0 g/dL   HCT 13.0 (L) 86.5 - 78.4 %   MCV 90.6 80.0 - 100.0 fL   MCH 30.0 26.0 - 34.0 pg   MCHC 33.2 30.0 - 36.0 g/dL   RDW 69.6 29.5 - 28.4 %   Platelets 215 150 - 400 K/uL   nRBC 0.0 0.0 - 0.2 %    Comment: Performed at [facility], [address] [place], [place] [address]  Ethanol     Status: None   Collection Time: 03/28/23 12:10 AM  Result Value Ref Range   Alcohol, Ethyl (B) <10 <10 mg/dL    Comment: (NOTE) Lowest detectable limit for serum alcohol is 10 mg/dL.  For medical purposes only. Performed at [facility], [address] [place], [place] [address]   Protime-INR     Status: None   Collection Time: 03/28/23 12:10 AM  Result Value Ref Range   Prothrombin Time 13.3 11.4 - 15.2 seconds   INR 1.0 0.8 - 1.2    Comment: (NOTE) INR goal varies based on device and disease states. Performed at [facility], [address], [place], [place] [address]   Sample to Blood Bank     Status: None   Collection Time: 03/28/23 12:10 AM  Result Value Ref Range   Blood Bank Specimen SAMPLE AVAILABLE FOR TESTING    Sample Expiration      03/31/2023,2359 Performed at [facility], [address] [place], [place] [address]   I-Stat Chem 8, ED     Status: Abnormal   Collection Time: 03/28/23 12:15 AM  Result Value Ref Range   Sodium 138 135 - 145 mmol/L   Potassium 4.0 3.5 - 5.1 mmol/L   Chloride 100 98 - 111 mmol/L   BUN 16 8 - 23 mg/dL   Creatinine, Ser 7.42 0.61 - 1.24 mg/dL   Glucose, Bld 595 (H) 70 - 99 mg/dL     Comment: Glucose reference range applies only to samples taken after fasting for at least 8 hours.   Calcium, Ion 1.15 1.15 - 1.40  mmol/L   TCO2 26 22 - 32 mmol/L   Hemoglobin 12.6 (L) 13.0 - 17.0 g/dL   HCT 16.1 (L) 09.6 - 04.5 %  I-Stat Lactic Acid, ED     Status: None   Collection Time: 03/28/23 12:19 AM  Result Value Ref Range   Lactic Acid, Venous 1.2 0.5 - 1.9 mmol/L  Urinalysis, Routine w reflex microscopic -Urine, Clean Catch     Status: Abnormal   Collection Time: 03/28/23  8:01 PM  Result Value Ref Range   Color, Urine YELLOW YELLOW   APPearance CLEAR CLEAR   Specific Gravity, Urine >1.046 (H) 1.005 - 1.030   pH 7.0 5.0 - 8.0   Glucose, UA NEGATIVE NEGATIVE mg/dL   Hgb urine dipstick NEGATIVE NEGATIVE   Bilirubin Urine NEGATIVE NEGATIVE   Ketones, ur NEGATIVE NEGATIVE mg/dL   Protein, ur NEGATIVE NEGATIVE mg/dL   Nitrite NEGATIVE NEGATIVE   Leukocytes,Ua NEGATIVE NEGATIVE    Comment: Performed at [facility], [address] [place], [place] [address]  CBC     Status: Abnormal   Collection Time: 03/29/23  4:53 AM  Result Value Ref Range   WBC 6.5 4.0 - 10.5 K/uL   RBC 3.56 (L) 4.22 - 5.81 MIL/uL   Hemoglobin 10.7 (L) 13.0 - 17.0 g/dL   HCT 19.1 (L) 47.8 - 29.5 %   MCV 92.1 80.0 - 100.0 fL   MCH 30.1 26.0 - 34.0 pg   MCHC 32.6 30.0 - 36.0 g/dL   RDW 62.1 30.8 - 65.7 %   Platelets 168 150 - 400 K/uL   nRBC 0.0 0.0 - 0.2 %    Comment: Performed at [facility], [address] [place], [place] [address]  Basic metabolic panel     Status: Abnormal   Collection Time: 03/29/23  4:53 AM  Result Value Ref Range   Sodium 136 135 - 145 mmol/L   Potassium 4.1 3.5 - 5.1 mmol/L   Chloride 102 98 - 111 mmol/L   CO2 26 22 - 32 mmol/L   Glucose, Bld 109 (H) 70 - 99 mg/dL    Comment: Glucose reference range applies only to samples taken after fasting for at least 8 hours.   BUN 17 8 - 23 mg/dL   Creatinine, Ser 2.95 0.61 - 1.24 mg/dL   Calcium 8.6 (L) 8.9 -  10.3 mg/dL   GFR, Estimated >28 >41 mL/min    Comment: (NOTE) Calculated using the CKD-EPI Creatinine Equation (2021)    Anion gap 8 5 - 15    Comment: Performed at [facility], [address], [place], [place] [address]   CT ANGIO HEAD NECK W WO CM Result Date: 03/28/2023 CLINICAL DATA:  Transient ischemic attack (TIA). EXAM: CT ANGIOGRAPHY HEAD AND NECK WITH AND WITHOUT CONTRAST TECHNIQUE: Multidetector CT imaging of the head and neck was performed using the standard protocol during bolus administration of intravenous contrast. Multiplanar CT image reconstructions and MIPs were obtained to evaluate the vascular anatomy. Carotid stenosis measurements (when applicable) are obtained utilizing NASCET criteria, using the distal internal carotid diameter as the denominator. RADIATION DOSE REDUCTION: This exam was performed according to the departmental dose-optimization program which includes automated exposure control, adjustment of the mA and/or kV according to patient size and/or use of iterative reconstruction technique. CONTRAST:  75mL OMNIPAQUE IOHEXOL 350 MG/ML SOLN COMPARISON:  CT head and cervical spine 03/28/2023. MRI brain and MRA head/neck 02/04/2023. FINDINGS: CT HEAD FINDINGS Brain: No acute hemorrhage.  Stable mild chronic small-vessel disease. No new loss of gray-white differentiation. No hydrocephalus or extra-axial collection. No mass effect or midline shift. Vascular: No hyperdense vessel or unexpected calcification. Skull: No calvarial fracture or suspicious bone lesion. Skull base is unremarkable. Sinuses/Orbits: Mild mucosal disease throughout the paranasal sinuses. Orbits are unremarkable. Other: None. Review of the MIP images confirms the above findings CTA NECK FINDINGS Aortic arch: Three-vessel arch configuration. Arch vessel origins are partially obscured by streak artifact from residual contrast bolus in the left subclavian vein. Right carotid system: No evidence of  dissection, stenosis (50% or greater), or occlusion. Moderate calcified plaque along the right carotid bulb. Left carotid system: Extensive, predominantly calcified plaque results in approximately 75% stenosis of the distal left CCA. Moderate mixed plaque along the left carotid bulb at the left ICA origin. Vertebral arteries: Codominant. Calcified plaque results in moderate stenosis of the left vertebral artery origin and severe stenosis of the right vertebral artery origin. Mild calcified plaque along the bilateral V4 segments. Skeleton: Prior C5-C7 ACDF with solid bony fusion across the intervening disc spaces. Hardware is intact without associated lucency. Degenerative anterolisthesis at the adjacent C4-5 segment resulting in no more than mild spinal canal stenosis. Unchanged comminuted fracture of the proximal right clavicle with anterosuperior displacement of the distal fracture fragment. Other neck: Unchanged associated hematoma in the right sternocleidomastoid muscle. Upper chest: Unremarkable. Review of the MIP images confirms the above findings CTA HEAD FINDINGS Anterior circulation: Calcified plaque along the carotid siphons without hemodynamically significant stenosis. The proximal ACAs and MCAs are patent without stenosis or aneurysm. Distal branches are symmetric. Posterior circulation: Normal basilar artery. The SCAs, AICAs and PICAs are patent proximally. The PCAs are patent proximally without stenosis or aneurysm. Distal branches are symmetric. Venous sinuses: As permitted by contrast timing, patent. Anatomic variants: None. Review of the MIP images confirms the above findings IMPRESSION: 1. No acute intracranial abnormality. 2. No large vessel occlusion. 3. Extensive, predominantly calcified plaque results in approximately 75% stenosis of the distal left CCA. 4. Moderate stenosis of the left vertebral artery origin and severe stenosis of the right vertebral artery origin. 5. Unchanged comminuted  fracture of the proximal right clavicle with anterosuperior displacement of the distal fracture fragment. Unchanged associated hematoma in the right sternocleidomastoid muscle. Electronically Signed   By: Orvan Falconer M.D.   On: 03/28/2023 13:34   CT HEAD WO CONTRAST ( ) Result Date: 03/28/2023 CLINICAL DATA:  Neck trauma (Age >= 65y); Head trauma, moderate-severe EXAM: CT HEAD WITHOUT CONTRAST CT CERVICAL SPINE WITHOUT CONTRAST TECHNIQUE: Multidetector CT imaging of the head and cervical spine was performed following the standard protocol without intravenous contrast. Multiplanar CT image reconstructions of the cervical spine were also generated. RADIATION DOSE REDUCTION: This exam was performed according to the departmental dose-optimization program which includes automated exposure control, adjustment of the mA and/or kV according to patient size and/or use of iterative reconstruction technique. COMPARISON:  CT head 02/04/2023. FINDINGS: CT HEAD FINDINGS Brain: No evidence of large-territorial acute infarction. No parenchymal hemorrhage. No mass lesion. No extra-axial collection. No mass effect or midline shift. No hydrocephalus. Basilar cisterns are patent. Vascular: No hyperdense vessel. Atherosclerotic calcifications are present within the cavernous internal carotid and vertebral arteries. Skull: No acute fracture or focal lesion. Sinuses/Orbits: Bilateral maxillary sinus mucosal thickening. Bilateral ethmoid mucosal thickening. Paranasal sinuses and mastoid air cells are clear. The orbits are unremarkable. Other: None. CT CERVICAL SPINE FINDINGS Alignment: Grade 1 anterolisthesis of C4 on C5. Skull base and  vertebrae: C5 - C7 anterior cervical discectomy and fusion. Multilevel severe degenerative changes spine most prominent at the C3-C4 levels. Associated severe osseous neural foraminal stenosis at the bilateral C3-C4 and C4-C5 levels. No acute fracture. No aggressive appearing focal osseous lesion  or focal pathologic process. Soft tissues and spinal canal: No prevertebral fluid or swelling. No visible canal hematoma. Upper chest: Unremarkable. Other: Please see separately dictated CT chest 03/28/2023 regarding sternocleidomastoid hemorrhage in clavicular fracture. Severe atherosclerotic plaque of the carotid arteries within the neck. IMPRESSION: 1. No acute intracranial abnormality. 2. No acute displaced fracture or traumatic listhesis of the cervical spine. 3. Bilateral C3-C4 and C4-C5 osseous neuroforaminal stenosis. 4. C5 - C7 anterior cervical discectomy and fusion. 5. Please see separately dictated CT chest 03/28/2023 regarding sternocleidomastoid hemorrhage in clavicular fracture. 6. Severe atherosclerotic plaque of the carotid arteries within the neck. Electronically Signed   By: Tish Frederickson M.D.   On: 03/28/2023 02:08   CT CERVICAL SPINE WO CONTRAST Result Date: 03/28/2023 CLINICAL DATA:  Neck trauma (Age >= 65y); Head trauma, moderate-severe EXAM: CT HEAD WITHOUT CONTRAST CT CERVICAL SPINE WITHOUT CONTRAST TECHNIQUE: Multidetector CT imaging of the head and cervical spine was performed following the standard protocol without intravenous contrast. Multiplanar CT image reconstructions of the cervical spine were also generated. RADIATION DOSE REDUCTION: This exam was performed according to the departmental dose-optimization program which includes automated exposure control, adjustment of the mA and/or kV according to patient size and/or use of iterative reconstruction technique. COMPARISON:  CT head 02/04/2023. FINDINGS: CT HEAD FINDINGS Brain: No evidence of large-territorial acute infarction. No parenchymal hemorrhage. No mass lesion. No extra-axial collection. No mass effect or midline shift. No hydrocephalus. Basilar cisterns are patent. Vascular: No hyperdense vessel. Atherosclerotic calcifications are present within the cavernous internal carotid and vertebral arteries. Skull: No acute  fracture or focal lesion. Sinuses/Orbits: Bilateral maxillary sinus mucosal thickening. Bilateral ethmoid mucosal thickening. Paranasal sinuses and mastoid air cells are clear. The orbits are unremarkable. Other: None. CT CERVICAL SPINE FINDINGS Alignment: Grade 1 anterolisthesis of C4 on C5. Skull base and vertebrae: C5 - C7 anterior cervical discectomy and fusion. Multilevel severe degenerative changes spine most prominent at the C3-C4 levels. Associated severe osseous neural foraminal stenosis at the bilateral C3-C4 and C4-C5 levels. No acute fracture. No aggressive appearing focal osseous lesion or focal pathologic process. Soft tissues and spinal canal: No prevertebral fluid or swelling. No visible canal hematoma. Upper chest: Unremarkable. Other: Please see separately dictated CT chest 03/28/2023 regarding sternocleidomastoid hemorrhage in clavicular fracture. Severe atherosclerotic plaque of the carotid arteries within the neck. IMPRESSION: 1. No acute intracranial abnormality. 2. No acute displaced fracture or traumatic listhesis of the cervical spine. 3. Bilateral C3-C4 and C4-C5 osseous neuroforaminal stenosis. 4. C5 - C7 anterior cervical discectomy and fusion. 5. Please see separately dictated CT chest 03/28/2023 regarding sternocleidomastoid hemorrhage in clavicular fracture. 6. Severe atherosclerotic plaque of the carotid arteries within the neck. Electronically Signed   By: Tish Frederickson M.D.   On: 03/28/2023 02:08   CT CHEST ABDOMEN PELVIS W CONTRAST Result Date: 03/28/2023 CLINICAL DATA:  Polytrauma, blunt.  Fall EXAM: CT CHEST, ABDOMEN, AND PELVIS WITH CONTRAST TECHNIQUE: Multidetector CT imaging of the chest, abdomen and pelvis was performed following the standard protocol during bolus administration of intravenous contrast. RADIATION DOSE REDUCTION: This exam was performed according to the departmental dose-optimization program which includes automated exposure control, adjustment of the mA  and/or kV according to patient size and/or use of iterative reconstruction  technique. CONTRAST:  75mL OMNIPAQUE IOHEXOL 350 MG/ML SOLN COMPARISON:  PET CT cardiac 02/23/2023, x-ray lumbar spine 08/15/2022, x-ray lumbar spine 09/25/2022 FINDINGS: CHEST: Cardiovascular: No aortic injury. The ascending thoracic aorta measures at the upper limits of normal (4 cm). The aortic root measures at the upper limits of normal (4 cm). The descending thoracic aorta is normal in caliber. The heart is normal in size. No significant pericardial effusion. Mild atherosclerotic plaque. Four-vessel coronary calcification. Mediastinum/Nodes: No pneumomediastinum. No mediastinal hematoma. The esophagus is unremarkable. The thyroid is unremarkable. The central airways are patent. No mediastinal, hilar, or axillary lymphadenopathy. Lungs/Pleura: Bilateral lower lobe atelectasis. Mild paraseptal emphysematous changes. No focal consolidation. No pulmonary nodule. No pulmonary mass. No pulmonary contusion or laceration. No pneumatocele formation. No pleural effusion. No pneumothorax. No hemothorax. Musculoskeletal/Chest wall: No chest wall mass. Acute comminuted and displaced proximal right clavicular fracture. Associated right pectoralis and sternoclavicular asymmetric enlargement as well as heterogeneity with surrounding fat stranding. Old healed right mid to distal clavicular fracture. Moderate degenerative changes of the right shoulder. Mild degenerative changes left shoulder. No acute rib or sternal fracture. No spinal fracture. ABDOMEN / PELVIS: Hepatobiliary: Not enlarged. No focal lesion. No laceration or subcapsular hematoma. The gallbladder is otherwise unremarkable with no radio-opaque gallstones. No biliary ductal dilatation. Pancreas: Normal pancreatic contour. No main pancreatic duct dilatation. Spleen: Not enlarged. No focal lesion. No laceration, subcapsular hematoma, or vascular injury. Adrenals/Urinary Tract: No nodularity  bilaterally. Bilateral kidneys enhance symmetrically. No hydronephrosis. No contusion, laceration, or subcapsular hematoma. No injury to the vascular structures or collecting systems. No hydroureter. Limited evaluation during bladder lumen due to streak artifact originating from femoral surgical hardware. On delayed imaging, there is no urothelial wall thickening and there are no filling defects in the opacified portions of the bilateral collecting systems or ureters. Stomach/Bowel: No small or large bowel wall thickening or dilatation. The appendix is unremarkable. Vasculature/Lymphatics: Severe atherosclerotic plaque. No abdominal aorta or iliac aneurysm. No active contrast extravasation or pseudoaneurysm. No abdominal, pelvic, inguinal lymphadenopathy. Reproductive: Normal. Other: No simple free fluid ascites. No pneumoperitoneum. No hemoperitoneum. No mesenteric hematoma identified. No organized fluid collection. Musculoskeletal: No significant soft tissue hematoma. No acute pelvic fracture. No spinal fracture. Stable grade 1 anterolisthesis of L4 on L5. Mild retrolisthesis of L1 on L2, L2 on L3, L3 on L4. Right proximal femoral surgical hardware. No right hip dislocation. Total left hip arthroplasty. L1-S1 posterolateral and interbody as well as left sacroiliac joint surgical hardware. No CT findings of surgical hardware complication. Ports and Devices: None. IMPRESSION: 1. Acute comminuted and displaced proximal right clavicular fracture. Associated right pectoralis and sternoclavicular (large) musculature hematoma. No mass effect on the trachea noted. 2. No acute intrathoracic, intra-abdominal, intrapelvic traumatic injury. 3. No acute fracture or traumatic malalignment of the thoracic or lumbar spine. 4. Aneurysmal ascending thoracic aorta (4 cm). Recommend annual imaging followup by CTA or MRA. This recommendation follows 2010 ACCF/AHA/AATS/ACR/ASA/SCA/SCAI/SIR/STS/SVM Guidelines for the Diagnosis and  Management of Patients with Thoracic Aortic Disease. Circulation. 2010; 121: R604-V409. Aortic aneurysm NOS (ICD10-I71.9). 5.  Aortic Atherosclerosis (ICD10-I70.0). 6.  Emphysema (ICD10-J43.9)-trace. Electronically Signed   By: Tish Frederickson M.D.   On: 03/28/2023 01:58   DG Pelvis Portable Result Date: 03/28/2023 CLINICAL DATA:  Trauma EXAM: PORTABLE PELVIS 1-2 VIEWS COMPARISON:  CT abdomen pelvis 03/28/2023. FINDINGS: Right proximal femoral surgical hardware. No right hip dislocation. No right hip fracture. Total left hip arthroplasty. No radiographic findings to suggest surgical hardware complication. There is no evidence of  pelvic fracture or diastasis. No pelvic bone lesions are seen. Lumbosacral and left sacroiliac joint surgical hardware noted. Vascular calcification. IMPRESSION: Negative for acute traumatic injury. Electronically Signed   By: Tish Frederickson M.D.   On: 03/28/2023 01:21   DG Knee Complete 4 Views Right Result Date: 03/28/2023 CLINICAL DATA:  fall EXAM: RIGHT KNEE - COMPLETE 4+ VIEW COMPARISON:  None Available. FINDINGS: Chondrocalcinosis. No evidence of fracture, dislocation, or joint effusion. Mild tricompartmental degenerative changes of the knee. Partially visualized old healed proximal mid fibular shaft fracture. Soft tissues are unremarkable. Vascular calcifications. IMPRESSION: No acute displaced fracture or dislocation. Electronically Signed   By: Tish Frederickson M.D.   On: 03/28/2023 01:18   DG Clavicle Right Result Date: 03/28/2023 CLINICAL DATA:  fall EXAM: RIGHT CLAVICLE - 2+ VIEWS COMPARISON:  None Available. FINDINGS: Acute comminuted and inferiorly displaced proximal right clavicular fracture. Old healed mid to distal right clavicular fracture. Acromioclavicular joint degenerative changes. Glenohumeral joint degenerative changes. Soft tissues are unremarkable. IMPRESSION: Acute comminuted and inferiorly displaced proximal right clavicular fracture. Electronically  Signed   By: Tish Frederickson M.D.   On: 03/28/2023 01:16   DG Chest Port 1 View Result Date: 03/28/2023 CLINICAL DATA:  Trauma EXAM: PORTABLE CHEST 1 VIEW COMPARISON:  Cardiac pet CT 02/23/2023, chest x-ray 07/24/2015 FINDINGS: The heart and mediastinal contours are within normal limits. Atherosclerotic plaque. Low lung volumes with question of left lower lobe airspace opacity. No pulmonary edema. No pleural effusion. No pneumothorax. No acute osseous abnormality. IMPRESSION: 1. Low lung volumes with question of left lower lobe airspace opacity. Recommend repeat chest x-ray PA and lateral view further evaluation. 2.  Aortic Atherosclerosis (ICD10-I70.0). Electronically Signed   By: Tish Frederickson M.D.   On: 03/28/2023 00:45    Pending Labs Unresulted Labs (From admission, onward)    None       Vitals/Pain Today's Vitals   03/29/23 0200 03/29/23 0410 03/29/23 0515 03/29/23 0600  BP: 120/67 110/69  137/61  Pulse: 73 63 65 64  Resp: 16 12 13 12   Temp:    98.3 F (36.8 C)  TempSrc:      SpO2: 94% 98% 100% 98%  Weight:      Height:      PainSc:        Isolation Precautions No active isolations  Medications Medications  acetaminophen (TYLENOL) tablet 1,000 mg (1,000 mg Oral Not Given 03/29/23 0550)  oxyCODONE (Oxy IR/ROXICODONE) immediate release tablet 2.5-5 mg (5 mg Oral Given 03/28/23 0649)  methocarbamol (ROBAXIN) tablet 500 mg (500 mg Oral Given 03/29/23 0547)    Or  methocarbamol (ROBAXIN) injection 500 mg ( Intravenous See Alternative 03/29/23 0547)  docusate sodium (COLACE) capsule 100 mg (100 mg Oral Given 03/28/23 2116)  polyethylene glycol (MIRALAX / GLYCOLAX) packet 17 g (has no administration in time range)  ondansetron (ZOFRAN-ODT) disintegrating tablet 4 mg (has no administration in time range)    Or  ondansetron (ZOFRAN) injection 4 mg (has no administration in time range)  metoprolol tartrate (LOPRESSOR) injection 5 mg (has no administration in time range)   hydrALAZINE (APRESOLINE) injection 10 mg (has no administration in time range)  morphine (PF) 4 MG/ML injection 4 mg (4 mg Intravenous Given 03/28/23 0026)  iohexol (OMNIPAQUE) 350 MG/ML injection 75 mL (75 mLs Intravenous Contrast Given 03/28/23 0116)  iohexol (OMNIPAQUE) 350 MG/ML injection 75 mL (75 mLs Intravenous Contrast Given 03/28/23 1258)    Mobility walks     Focused Assessments Musculoskeletal   R  Recommendations: See Admitting Provider Note  Report given to:   Additional Notes:

## 2023-03-29 NOTE — Progress Notes (Signed)
[facility] Progress Note     Subjective: CC:  States his pain is controlled, refused oxy this AM. Tolerated 100% of breakfast. Mobilized with PT/OT. Would like to go home.  Objective: Vital signs in last 24 hours: Temp:  [97.7 F (36.5 C)-98.6 F (37 C)] 98.3 F (36.8 C) (12/30 0600) Pulse Rate:  [63-88] 84 (12/30 0900) Resp:  [12-21] 17 (12/30 0900) BP: (110-151)/(61-80) 143/80 (12/30 0900) SpO2:  [93 %-100 %] 98 % (12/30 0900)    Intake/Output from previous day: No intake/output data recorded. Intake/Output this shift: No intake/output data recorded.  PE: Gen:  Alert, NAD, pleasant Card:  Regular rate and rhythm Pulm:  Normal effort ORA Abd: Soft, non-tender, non-distended MSK: R clavicle with overlying edema and evolving hematoma. Hematoma does not appear to be expanding. Skin: warm and dry, no rashes  Psych: A&Ox3   Lab Results:  Recent Labs    03/28/23 0010 03/28/23 0015 03/29/23 0453  WBC 7.2  --  6.5  HGB 12.4* 12.6* 10.7*  HCT 37.4* 37.0* 32.8*  PLT 215  --  168   BMET Recent Labs    03/28/23 0010 03/28/23 0015 03/29/23 0453  NA 136 138 136  K 4.2 4.0 4.1  CL 102 100 102  CO2 24  --  26  GLUCOSE 163* 175* 109*  BUN 15 16 17   CREATININE 0.99 1.00 0.94  CALCIUM 8.9  --  8.6*   PT/INR Recent Labs    03/28/23 0010  LABPROT 13.3  INR 1.0   CMP     Component Value Date/Time   NA 136 03/29/2023 0453   K 4.1 03/29/2023 0453   CL 102 03/29/2023 0453   CO2 26 03/29/2023 0453   GLUCOSE 109 (H) 03/29/2023 0453   BUN 17 03/29/2023 0453   CREATININE 0.94 03/29/2023 0453   CALCIUM 8.6 (L) 03/29/2023 0453   PROT 6.4 (L) 03/28/2023 0010   ALBUMIN 3.7 03/28/2023 0010   AST 28 03/28/2023 0010   ALT 26 03/28/2023 0010   ALKPHOS 67 03/28/2023 0010   BILITOT 0.4 03/28/2023 0010   GFRNONAA >60 03/29/2023 0453   Lipase  No results found for: "LIPASE"     Studies/Results: CT ANGIO HEAD NECK W WO CM Result Date:  03/28/2023 CLINICAL DATA:  Transient ischemic attack (TIA). EXAM: CT ANGIOGRAPHY HEAD AND NECK WITH AND WITHOUT CONTRAST TECHNIQUE: Multidetector CT imaging of the head and neck was performed using the standard protocol during bolus administration of intravenous contrast. Multiplanar CT image reconstructions and MIPs were obtained to evaluate the vascular anatomy. Carotid stenosis measurements (when applicable) are obtained utilizing NASCET criteria, using the distal internal carotid diameter as the denominator. RADIATION DOSE REDUCTION: This exam was performed according to the departmental dose-optimization program which includes automated exposure control, adjustment of the mA and/or kV according to patient size and/or use of iterative reconstruction technique. CONTRAST:  75mL OMNIPAQUE IOHEXOL 350 MG/ML SOLN COMPARISON:  CT head and cervical spine 03/28/2023. MRI brain and MRA head/neck 02/04/2023. FINDINGS: CT HEAD FINDINGS Brain: No acute hemorrhage. Stable mild chronic small-vessel disease. No new loss of gray-white differentiation. No hydrocephalus or extra-axial collection. No mass effect or midline shift. Vascular: No hyperdense vessel or unexpected calcification. Skull: No calvarial fracture or suspicious bone lesion. Skull base is unremarkable. Sinuses/Orbits: Mild mucosal disease throughout the paranasal sinuses. Orbits are unremarkable. Other: None. Review of the MIP images confirms the above findings CTA NECK FINDINGS Aortic arch: Three-vessel arch configuration. Arch vessel origins are partially  obscured by streak artifact from residual contrast bolus in the left subclavian vein. Right carotid system: No evidence of dissection, stenosis (50% or greater), or occlusion. Moderate calcified plaque along the right carotid bulb. Left carotid system: Extensive, predominantly calcified plaque results in approximately 75% stenosis of the distal left CCA. Moderate mixed plaque along the left carotid bulb at  the left ICA origin. Vertebral arteries: Codominant. Calcified plaque results in moderate stenosis of the left vertebral artery origin and severe stenosis of the right vertebral artery origin. Mild calcified plaque along the bilateral V4 segments. Skeleton: Prior C5-C7 ACDF with solid bony fusion across the intervening disc spaces. Hardware is intact without associated lucency. Degenerative anterolisthesis at the adjacent C4-5 segment resulting in no more than mild spinal canal stenosis. Unchanged comminuted fracture of the proximal right clavicle with anterosuperior displacement of the distal fracture fragment. Other neck: Unchanged associated hematoma in the right sternocleidomastoid muscle. Upper chest: Unremarkable. Review of the MIP images confirms the above findings CTA HEAD FINDINGS Anterior circulation: Calcified plaque along the carotid siphons without hemodynamically significant stenosis. The proximal ACAs and MCAs are patent without stenosis or aneurysm. Distal branches are symmetric. Posterior circulation: Normal basilar artery. The SCAs, AICAs and PICAs are patent proximally. The PCAs are patent proximally without stenosis or aneurysm. Distal branches are symmetric. Venous sinuses: As permitted by contrast timing, patent. Anatomic variants: None. Review of the MIP images confirms the above findings IMPRESSION: 1. No acute intracranial abnormality. 2. No large vessel occlusion. 3. Extensive, predominantly calcified plaque results in approximately 75% stenosis of the distal left CCA. 4. Moderate stenosis of the left vertebral artery origin and severe stenosis of the right vertebral artery origin. 5. Unchanged comminuted fracture of the proximal right clavicle with anterosuperior displacement of the distal fracture fragment. Unchanged associated hematoma in the right sternocleidomastoid muscle. Electronically Signed   By: Orvan Falconer M.D.   On: 03/28/2023 13:34   CT HEAD WO CONTRAST ( ) Result  Date: 03/28/2023 CLINICAL DATA:  Neck trauma (Age >= 65y); Head trauma, moderate-severe EXAM: CT HEAD WITHOUT CONTRAST CT CERVICAL SPINE WITHOUT CONTRAST TECHNIQUE: Multidetector CT imaging of the head and cervical spine was performed following the standard protocol without intravenous contrast. Multiplanar CT image reconstructions of the cervical spine were also generated. RADIATION DOSE REDUCTION: This exam was performed according to the departmental dose-optimization program which includes automated exposure control, adjustment of the mA and/or kV according to patient size and/or use of iterative reconstruction technique. COMPARISON:  CT head 02/04/2023. FINDINGS: CT HEAD FINDINGS Brain: No evidence of large-territorial acute infarction. No parenchymal hemorrhage. No mass lesion. No extra-axial collection. No mass effect or midline shift. No hydrocephalus. Basilar cisterns are patent. Vascular: No hyperdense vessel. Atherosclerotic calcifications are present within the cavernous internal carotid and vertebral arteries. Skull: No acute fracture or focal lesion. Sinuses/Orbits: Bilateral maxillary sinus mucosal thickening. Bilateral ethmoid mucosal thickening. Paranasal sinuses and mastoid air cells are clear. The orbits are unremarkable. Other: None. CT CERVICAL SPINE FINDINGS Alignment: Grade 1 anterolisthesis of C4 on C5. Skull base and vertebrae: C5 - C7 anterior cervical discectomy and fusion. Multilevel severe degenerative changes spine most prominent at the C3-C4 levels. Associated severe osseous neural foraminal stenosis at the bilateral C3-C4 and C4-C5 levels. No acute fracture. No aggressive appearing focal osseous lesion or focal pathologic process. Soft tissues and spinal canal: No prevertebral fluid or swelling. No visible canal hematoma. Upper chest: Unremarkable. Other: Please see separately dictated CT chest 03/28/2023 regarding sternocleidomastoid hemorrhage in clavicular  fracture. Severe  atherosclerotic plaque of the carotid arteries within the neck. IMPRESSION: 1. No acute intracranial abnormality. 2. No acute displaced fracture or traumatic listhesis of the cervical spine. 3. Bilateral C3-C4 and C4-C5 osseous neuroforaminal stenosis. 4. C5 - C7 anterior cervical discectomy and fusion. 5. Please see separately dictated CT chest 03/28/2023 regarding sternocleidomastoid hemorrhage in clavicular fracture. 6. Severe atherosclerotic plaque of the carotid arteries within the neck. Electronically Signed   By: Tish Frederickson M.D.   On: 03/28/2023 02:08   CT CERVICAL SPINE WO CONTRAST Result Date: 03/28/2023 CLINICAL DATA:  Neck trauma (Age >= 65y); Head trauma, moderate-severe EXAM: CT HEAD WITHOUT CONTRAST CT CERVICAL SPINE WITHOUT CONTRAST TECHNIQUE: Multidetector CT imaging of the head and cervical spine was performed following the standard protocol without intravenous contrast. Multiplanar CT image reconstructions of the cervical spine were also generated. RADIATION DOSE REDUCTION: This exam was performed according to the departmental dose-optimization program which includes automated exposure control, adjustment of the mA and/or kV according to patient size and/or use of iterative reconstruction technique. COMPARISON:  CT head 02/04/2023. FINDINGS: CT HEAD FINDINGS Brain: No evidence of large-territorial acute infarction. No parenchymal hemorrhage. No mass lesion. No extra-axial collection. No mass effect or midline shift. No hydrocephalus. Basilar cisterns are patent. Vascular: No hyperdense vessel. Atherosclerotic calcifications are present within the cavernous internal carotid and vertebral arteries. Skull: No acute fracture or focal lesion. Sinuses/Orbits: Bilateral maxillary sinus mucosal thickening. Bilateral ethmoid mucosal thickening. Paranasal sinuses and mastoid air cells are clear. The orbits are unremarkable. Other: None. CT CERVICAL SPINE FINDINGS Alignment: Grade 1 anterolisthesis  of C4 on C5. Skull base and vertebrae: C5 - C7 anterior cervical discectomy and fusion. Multilevel severe degenerative changes spine most prominent at the C3-C4 levels. Associated severe osseous neural foraminal stenosis at the bilateral C3-C4 and C4-C5 levels. No acute fracture. No aggressive appearing focal osseous lesion or focal pathologic process. Soft tissues and spinal canal: No prevertebral fluid or swelling. No visible canal hematoma. Upper chest: Unremarkable. Other: Please see separately dictated CT chest 03/28/2023 regarding sternocleidomastoid hemorrhage in clavicular fracture. Severe atherosclerotic plaque of the carotid arteries within the neck. IMPRESSION: 1. No acute intracranial abnormality. 2. No acute displaced fracture or traumatic listhesis of the cervical spine. 3. Bilateral C3-C4 and C4-C5 osseous neuroforaminal stenosis. 4. C5 - C7 anterior cervical discectomy and fusion. 5. Please see separately dictated CT chest 03/28/2023 regarding sternocleidomastoid hemorrhage in clavicular fracture. 6. Severe atherosclerotic plaque of the carotid arteries within the neck. Electronically Signed   By: Tish Frederickson M.D.   On: 03/28/2023 02:08   CT CHEST ABDOMEN PELVIS W CONTRAST Result Date: 03/28/2023 CLINICAL DATA:  Polytrauma, blunt.  Fall EXAM: CT CHEST, ABDOMEN, AND PELVIS WITH CONTRAST TECHNIQUE: Multidetector CT imaging of the chest, abdomen and pelvis was performed following the standard protocol during bolus administration of intravenous contrast. RADIATION DOSE REDUCTION: This exam was performed according to the departmental dose-optimization program which includes automated exposure control, adjustment of the mA and/or kV according to patient size and/or use of iterative reconstruction technique. CONTRAST:  75mL OMNIPAQUE IOHEXOL 350 MG/ML SOLN COMPARISON:  PET CT cardiac 02/23/2023, x-ray lumbar spine 08/15/2022, x-ray lumbar spine 09/25/2022 FINDINGS: CHEST: Cardiovascular: No aortic  injury. The ascending thoracic aorta measures at the upper limits of normal (4 cm). The aortic root measures at the upper limits of normal (4 cm). The descending thoracic aorta is normal in caliber. The heart is normal in size. No significant pericardial effusion. Mild atherosclerotic plaque. Four-vessel  coronary calcification. Mediastinum/Nodes: No pneumomediastinum. No mediastinal hematoma. The esophagus is unremarkable. The thyroid is unremarkable. The central airways are patent. No mediastinal, hilar, or axillary lymphadenopathy. Lungs/Pleura: Bilateral lower lobe atelectasis. Mild paraseptal emphysematous changes. No focal consolidation. No pulmonary nodule. No pulmonary mass. No pulmonary contusion or laceration. No pneumatocele formation. No pleural effusion. No pneumothorax. No hemothorax. Musculoskeletal/Chest wall: No chest wall mass. Acute comminuted and displaced proximal right clavicular fracture. Associated right pectoralis and sternoclavicular asymmetric enlargement as well as heterogeneity with surrounding fat stranding. Old healed right mid to distal clavicular fracture. Moderate degenerative changes of the right shoulder. Mild degenerative changes left shoulder. No acute rib or sternal fracture. No spinal fracture. ABDOMEN / PELVIS: Hepatobiliary: Not enlarged. No focal lesion. No laceration or subcapsular hematoma. The gallbladder is otherwise unremarkable with no radio-opaque gallstones. No biliary ductal dilatation. Pancreas: Normal pancreatic contour. No main pancreatic duct dilatation. Spleen: Not enlarged. No focal lesion. No laceration, subcapsular hematoma, or vascular injury. Adrenals/Urinary Tract: No nodularity bilaterally. Bilateral kidneys enhance symmetrically. No hydronephrosis. No contusion, laceration, or subcapsular hematoma. No injury to the vascular structures or collecting systems. No hydroureter. Limited evaluation during bladder lumen due to streak artifact originating from  femoral surgical hardware. On delayed imaging, there is no urothelial wall thickening and there are no filling defects in the opacified portions of the bilateral collecting systems or ureters. Stomach/Bowel: No small or large bowel wall thickening or dilatation. The appendix is unremarkable. Vasculature/Lymphatics: Severe atherosclerotic plaque. No abdominal aorta or iliac aneurysm. No active contrast extravasation or pseudoaneurysm. No abdominal, pelvic, inguinal lymphadenopathy. Reproductive: Normal. Other: No simple free fluid ascites. No pneumoperitoneum. No hemoperitoneum. No mesenteric hematoma identified. No organized fluid collection. Musculoskeletal: No significant soft tissue hematoma. No acute pelvic fracture. No spinal fracture. Stable grade 1 anterolisthesis of L4 on L5. Mild retrolisthesis of L1 on L2, L2 on L3, L3 on L4. Right proximal femoral surgical hardware. No right hip dislocation. Total left hip arthroplasty. L1-S1 posterolateral and interbody as well as left sacroiliac joint surgical hardware. No CT findings of surgical hardware complication. Ports and Devices: None. IMPRESSION: 1. Acute comminuted and displaced proximal right clavicular fracture. Associated right pectoralis and sternoclavicular (large) musculature hematoma. No mass effect on the trachea noted. 2. No acute intrathoracic, intra-abdominal, intrapelvic traumatic injury. 3. No acute fracture or traumatic malalignment of the thoracic or lumbar spine. 4. Aneurysmal ascending thoracic aorta (4 cm). Recommend annual imaging followup by CTA or MRA. This recommendation follows 2010 ACCF/AHA/AATS/ACR/ASA/SCA/SCAI/SIR/STS/SVM Guidelines for the Diagnosis and Management of Patients with Thoracic Aortic Disease. Circulation. 2010; 121: K440-N027. Aortic aneurysm NOS (ICD10-I71.9). 5.  Aortic Atherosclerosis (ICD10-I70.0). 6.  Emphysema (ICD10-J43.9)-trace. Electronically Signed   By: Tish Frederickson M.D.   On: 03/28/2023 01:58   DG Pelvis  Portable Result Date: 03/28/2023 CLINICAL DATA:  Trauma EXAM: PORTABLE PELVIS 1-2 VIEWS COMPARISON:  CT abdomen pelvis 03/28/2023. FINDINGS: Right proximal femoral surgical hardware. No right hip dislocation. No right hip fracture. Total left hip arthroplasty. No radiographic findings to suggest surgical hardware complication. There is no evidence of pelvic fracture or diastasis. No pelvic bone lesions are seen. Lumbosacral and left sacroiliac joint surgical hardware noted. Vascular calcification. IMPRESSION: Negative for acute traumatic injury. Electronically Signed   By: Tish Frederickson M.D.   On: 03/28/2023 01:21   DG Knee Complete 4 Views Right Result Date: 03/28/2023 CLINICAL DATA:  fall EXAM: RIGHT KNEE - COMPLETE 4+ VIEW COMPARISON:  None Available. FINDINGS: Chondrocalcinosis. No evidence of fracture, dislocation, or joint effusion.  Mild tricompartmental degenerative changes of the knee. Partially visualized old healed proximal mid fibular shaft fracture. Soft tissues are unremarkable. Vascular calcifications. IMPRESSION: No acute displaced fracture or dislocation. Electronically Signed   By: Tish Frederickson M.D.   On: 03/28/2023 01:18   DG Clavicle Right Result Date: 03/28/2023 CLINICAL DATA:  fall EXAM: RIGHT CLAVICLE - 2+ VIEWS COMPARISON:  None Available. FINDINGS: Acute comminuted and inferiorly displaced proximal right clavicular fracture. Old healed mid to distal right clavicular fracture. Acromioclavicular joint degenerative changes. Glenohumeral joint degenerative changes. Soft tissues are unremarkable. IMPRESSION: Acute comminuted and inferiorly displaced proximal right clavicular fracture. Electronically Signed   By: Tish Frederickson M.D.   On: 03/28/2023 01:16   DG Chest Port 1 View Result Date: 03/28/2023 CLINICAL DATA:  Trauma EXAM: PORTABLE CHEST 1 VIEW COMPARISON:  Cardiac pet CT 02/23/2023, chest x-ray 07/24/2015 FINDINGS: The heart and mediastinal contours are within normal  limits. Atherosclerotic plaque. Low lung volumes with question of left lower lobe airspace opacity. No pulmonary edema. No pleural effusion. No pneumothorax. No acute osseous abnormality. IMPRESSION: 1. Low lung volumes with question of left lower lobe airspace opacity. Recommend repeat chest x-ray PA and lateral view further evaluation. 2.  Aortic Atherosclerosis (ICD10-I70.0). Electronically Signed   By: Tish Frederickson M.D.   On: 03/28/2023 00:45    Anti-infectives: Anti-infectives (From admission, onward)    None        Assessment/Plan  Mechanical fall down stairs   R clavicle fx - ortho c/s, Dr. Hulda Humphrey, non-op mgmt, NWB, sling, outpatient follow up 7-10 days R chest wall and neck hematoma - appears stable on exam, hgb 10.7 from 12.4  H/o stroke on DAPT - [facility] following, CTA head/neck done yesterday shows 75% occlusion L CCA. Hold DAPT for now, joint decision with [facility] regarding duration to hold to be made at time of discharge.  FEN - regular diet DVT - SCDs, hold chemical ppx due to bleeding concerns Dispo - 4NP. Likely stable for discharge this afternoon pending [facility] evaluation. Will re-check hgb at 1300    LOS: 0 days   I reviewed nursing notes, ED provider notes, Consultant vascular notes, last 24 h vitals and pain scores, last 48 h intake and output, last 24 h labs and trends, and last 24 h imaging results.  This care required moderate level of medical decision making.   Hosie Spangle, PA-C [facility] Please see Amion for pager number during day hours 7:00am-4:30pm

## 2023-04-01 DIAGNOSIS — S42001A Fracture of unspecified part of right clavicle, initial encounter for closed fracture: Secondary | ICD-10-CM | POA: Diagnosis not present

## 2023-04-07 ENCOUNTER — Ambulatory Visit (HOSPITAL_COMMUNITY)
Admission: RE | Admit: 2023-04-07 | Discharge: 2023-04-07 | Disposition: A | Discharge status: Home / Self Care | Payer: Medicare Other | Source: Ambulatory Visit | Attending: Vascular Surgery | Admitting: Vascular Surgery

## 2023-04-07 ENCOUNTER — Ambulatory Visit: Payer: Medicare Other | Admitting: Vascular Surgery

## 2023-04-07 ENCOUNTER — Encounter: Payer: Self-pay | Admitting: Vascular Surgery

## 2023-04-07 VITALS — BP 153/83 | HR 69 | Temp 98.2°F | Resp 20 | Ht 70.5 in | Wt 195.0 lb

## 2023-04-07 DIAGNOSIS — I6522 Occlusion and stenosis of left carotid artery: Secondary | ICD-10-CM | POA: Diagnosis not present

## 2023-04-07 NOTE — Progress Notes (Signed)
 Patient ID: Charlie DELENA Ingles, male   DOB: 01/16/1951, 73 y.o.   MRN: [id number]  Reason for Consult: Follow-up   Referred by Okey Carlin Redbird, MD  Subjective:     HPI:  NHAT HEARNE is a 73 y.o. male with known carotid artery stenosis and a history of left-sided vision changes.  Patient describes the initial visual changes blurry vision which has significantly improved.  He was on aspirin  Plavix  and statin but recently suffered a fall with significant hematoma and clavicular fracture on the right for which Plavix  was stopped he is now on aspirin  and statin and hematoma is now resolving as well.  He is wearing a sling today.  He has no further visual changes and is here with carotid duplex today.  Past Medical History:  Diagnosis Date   Arthritis    Back   Back pain    Cancer of prostate (HCC)    Carotid artery occlusion    Central serous retinopathy    High blood pressure    High cholesterol    Mild depression    Neck pain    PONV (postoperative nausea and vomiting)    Pre-diabetes    Right arm pain    Family History  Problem Relation Age of Onset   Thyroid disease Mother    Heart disease Mother    Cancer Mother    High blood pressure Mother    Colon polyps Mother    Cancer - Lung Mother    Colon polyps Father    CAD Father    Cancer - Prostate Father    High blood pressure Father    Heart disease Father    COPD Father    CVA Father    Cancer - Prostate Brother    Past Surgical History:  Procedure Laterality Date   ANTERIOR LAT LUMBAR FUSION N/A 08/10/2022   Procedure: Lumbar One-Two, Lumbar Two-Three, Lumbar Three-Four Anterior Lateral Lumbar Fusion;  Surgeon: Colon Shove, MD;  Location: [facility] OR;  Service: Neurosurgery;  Laterality: N/A;   CATARACT EXTRACTION W/ INTRAOCULAR LENS IMPLANT Bilateral 2022   COLONOSCOPY  2023   FRACTURE SURGERY Right 1975   Right Leg   HIP SURGERY Right 2002   Ball and Socket repaired   NECK SURGERY  2014   Cadaver disc  placed in neck   PROSTATE SURGERY     2007    Short Social History:  Social History   Tobacco Use   Smoking status: Former    Current packs/day: 0.00    Types: Cigarettes    Quit date: 1995    Years since quitting: 30.0   Smokeless tobacco: Never   Tobacco comments:    July 1995  Substance Use Topics   Alcohol use: No    Comment: quit Oct.1996    Allergies  Allergen Reactions   Ace Inhibitors Cough   Corticosteroids     Other Reaction(s): Central seroud retinopathy   Tape Swelling   Wound Dressing Adhesive Swelling    Current Outpatient Medications  Medication Sig Dispense Refill   acetaminophen  (TYLENOL ) 500 MG tablet Take 2 tablets (1,000 mg total) by mouth every 6 (six) hours.     ALPRAZolam  (XANAX ) 0.25 MG tablet Take 0.25-0.5 mg by mouth 2 (two) times daily as needed (Restless leg).     amLODipine  (NORVASC ) 5 MG tablet Take 7.5 mg by mouth daily.     Ascorbic Acid (VITAMIN C) 1000 MG tablet Take 1,000 mg by mouth daily.  aspirin  EC 81 MG tablet Take 1 tablet (81 mg total) by mouth daily. Swallow whole. 30 tablet 0   cholecalciferol  (VITAMIN D3) 25 MCG (1000 UNIT) tablet Take 1,000 Units by mouth daily.     docusate sodium  (COLACE) 100 MG capsule Take 1 capsule (100 mg total) by mouth 2 (two) times daily.     DULoxetine  (CYMBALTA ) 60 MG capsule Take 60 mg by mouth daily.     ferrous sulfate 325 (65 FE) MG tablet Take 325 mg by mouth daily with breakfast.     L-Methylfolate 15 MG TABS Take 7.5 mg by mouth daily.     loratadine  (CLARITIN ) 10 MG tablet Take 10 mg by mouth daily as needed for allergies.     methocarbamol  (ROBAXIN ) 500 MG tablet Take 1 tablet (500 mg total) by mouth every 6 (six) hours as needed for muscle spasms. 40 tablet 0   Multiple Vitamins-Minerals (MULTIVITAMIN PO) Take 1 tablet by mouth daily. One a day     omeprazole (PRILOSEC) 20 MG capsule Take 20 mg by mouth daily.     pramipexole  (MIRAPEX ) 0.5 MG tablet Take 0.5 mg by mouth 2 (two) times  daily.     rosuvastatin  (CRESTOR ) 40 MG tablet Take 1 tablet (40 mg total) by mouth daily. (Patient taking differently: Take 40 mg by mouth at bedtime.) 90 tablet 2   No current facility-administered medications for this visit.    Review of Systems  Constitutional:  Constitutional negative. Eyes: Positive for visual disturbance.   Cardiovascular: Cardiovascular negative.  GI: Gastrointestinal negative.  Musculoskeletal:       Clavicular pain resolving on the right Skin: Skin negative.  Neurological: Neurological negative. Hematologic: Positive for bruises/bleeds easily.        Objective:  Objective   Vitals:   04/07/23 1540  BP: (!) 153/83  Pulse: 69  Resp: 20  Temp: 98.2 F (36.8 C)  SpO2: 98%  Weight: 195 lb (88.5 kg)  Height: 5' 10.5 (1.791 m)   Body mass index is 27.58 kg/m.  Physical Exam HENT:     Head: Normocephalic.     Nose: Nose normal.  Eyes:     Pupils: Pupils are equal, round, and reactive to light.  Neck:     Vascular: No carotid bruit.     Comments: Hematoma of the right neck and chest settling into the abdomen Cardiovascular:     Rate and Rhythm: Normal rate.  Pulmonary:     Effort: Pulmonary effort is normal.  Abdominal:     General: Abdomen is flat.  Musculoskeletal:     Comments: Wearing a sling of the right upper extremity  Skin:    Capillary Refill: Capillary refill takes less than 2 seconds.  Neurological:     General: No focal deficit present.     Mental Status: He is alert.  Psychiatric:        Mood and Affect: Mood normal.     Data: Right Carotid Findings:  +----------+--------+--------+--------+------------------+--------+           PSV cm/sEDV cm/sStenosisPlaque DescriptionComments  +----------+--------+--------+--------+------------------+--------+  CCA Prox  96      18                                          +----------+--------+--------+--------+------------------+--------+  CCA Mid   97      21                                           +----------+--------+--------+--------+------------------+--------+  CCA Distal115     22                                          +----------+--------+--------+--------+------------------+--------+  ICA Prox  157     25      1-39%   calcific                    +----------+--------+--------+--------+------------------+--------+  ICA Mid   55      14                                          +----------+--------+--------+--------+------------------+--------+  ICA Distal53      16                                          +----------+--------+--------+--------+------------------+--------+  ECA      205     24                                          +----------+--------+--------+--------+------------------+--------+   +----------+--------+-------+----------------+-------------------+           PSV cm/sEDV cmsDescribe        Arm Pressure (mmHG)  +----------+--------+-------+----------------+-------------------+  [id number]     4      Multiphasic, [id number]                  +----------+--------+-------+----------------+-------------------+   +---------+--------+--+--------+--+---------+  VertebralPSV cm/s50EDV cm/s11Antegrade  +---------+--------+--+--------+--+---------+      Left Carotid Findings:  +----------+--------+--------+--------+------------------+--------+           PSV cm/sEDV cm/sStenosisPlaque DescriptionComments  +----------+--------+--------+--------+------------------+--------+  CCA Prox  143     23              heterogenous                +----------+--------+--------+--------+------------------+--------+  CCA Mid   143     23              heterogenous                +----------+--------+--------+--------+------------------+--------+  CCA Distal244     63      >50%                                 +----------+--------+--------+--------+------------------+--------+  ICA Prox  322     76      60-79%  calcific                    +----------+--------+--------+--------+------------------+--------+  ICA Mid   60      23                                          +----------+--------+--------+--------+------------------+--------+  ICA Distal51      18                                          +----------+--------+--------+--------+------------------+--------+  ECA      441     77      >50%                                +----------+--------+--------+--------+------------------+--------+   +----------+--------+--------+----------------+-------------------+           PSV cm/sEDV cm/sDescribe        Arm Pressure (mmHG)  +----------+--------+--------+----------------+-------------------+  [id number]    7       Multiphasic, [id number]                  +----------+--------+--------+----------------+-------------------+   +---------+--------+--+--------+--+---------+  VertebralPSV cm/s61EDV cm/s15Antegrade  +---------+--------+--+--------+--+---------+         Summary:  Right Carotid: Velocities in the right ICA are consistent with a 1-39%  stenosis.   Left Carotid: Velocities in the left ICA / bifurcation are consistent with  a               60-79% stenosis with increased velocity in the distal CCA /                bifurcation..   Vertebrals:  Bilateral vertebral arteries demonstrate antegrade flow.  Subclavians: Normal flow hemodynamics were seen in bilateral subclavian               arteries.       Assessment/Plan:     73 year old male with what seems to be asymptomatic high-grade left distal common carotid artery stenosis that is heavily calcified.  This was initially evaluated with MRA and is subsequently been evaluated in the emergency department with CT angio and now carotid duplex although there are not good criteria to perfectly  evaluate common carotid artery stenosis areas significant post MI dilatation of the ICA proximally.  This was discussed with the patient and imaging reviewed.  We discussed his options being continued medical therapy and to restart Plavix  when his hematoma resolves likely in another 2 to 3 weeks so he will be on dual antiplatelet therapy and statin and he agrees to this.  His other options would be carotid stenting of which I do not think he is a good transfemoral or transcarotid artery stent candidate due to the heavy calcified nature of this lesion.  We could also consider carotid endarterectomy.  Patient will remain on best medical therapy at this time with his recent clavicular fracture and significant hematoma and I will follow him up towards the end of his semester in [employer] where we will consider left carotid endarterectomy.  We discussed the signs and symptoms of stroke he demonstrates good understanding and short of this I will see him when school he is nearing and into the semester.    Penne Lonni Colorado MD [facility]

## 2023-04-12 DIAGNOSIS — H3581 Retinal edema: Secondary | ICD-10-CM | POA: Diagnosis not present

## 2023-04-16 DIAGNOSIS — H3581 Retinal edema: Secondary | ICD-10-CM | POA: Diagnosis not present

## 2023-04-23 DIAGNOSIS — M65342 Trigger finger, left ring finger: Secondary | ICD-10-CM | POA: Diagnosis not present

## 2023-04-23 DIAGNOSIS — S42001D Fracture of unspecified part of right clavicle, subsequent encounter for fracture with routine healing: Secondary | ICD-10-CM | POA: Diagnosis not present

## 2023-04-27 DIAGNOSIS — Z6828 Body mass index (BMI) 28.0-28.9, adult: Secondary | ICD-10-CM | POA: Diagnosis not present

## 2023-04-27 DIAGNOSIS — R053 Chronic cough: Secondary | ICD-10-CM | POA: Diagnosis not present

## 2023-05-10 DIAGNOSIS — H3581 Retinal edema: Secondary | ICD-10-CM | POA: Diagnosis not present

## 2023-05-10 DIAGNOSIS — H3582 Retinal ischemia: Secondary | ICD-10-CM | POA: Diagnosis not present

## 2023-05-10 DIAGNOSIS — I6522 Occlusion and stenosis of left carotid artery: Secondary | ICD-10-CM | POA: Diagnosis not present

## 2023-06-07 DIAGNOSIS — H34812 Central retinal vein occlusion, left eye, with macular edema: Secondary | ICD-10-CM | POA: Diagnosis not present

## 2023-06-07 DIAGNOSIS — I998 Other disorder of circulatory system: Secondary | ICD-10-CM | POA: Diagnosis not present

## 2023-06-07 DIAGNOSIS — H348122 Central retinal vein occlusion, left eye, stable: Secondary | ICD-10-CM | POA: Diagnosis not present

## 2023-06-07 DIAGNOSIS — H3581 Retinal edema: Secondary | ICD-10-CM | POA: Diagnosis not present

## 2023-06-07 DIAGNOSIS — Z8669 Personal history of other diseases of the nervous system and sense organs: Secondary | ICD-10-CM | POA: Diagnosis not present

## 2023-06-08 DIAGNOSIS — Z6828 Body mass index (BMI) 28.0-28.9, adult: Secondary | ICD-10-CM | POA: Diagnosis not present

## 2023-06-08 DIAGNOSIS — F411 Generalized anxiety disorder: Secondary | ICD-10-CM | POA: Diagnosis not present

## 2023-06-08 DIAGNOSIS — I10 Essential (primary) hypertension: Secondary | ICD-10-CM | POA: Diagnosis not present

## 2023-06-08 DIAGNOSIS — Z8673 Personal history of transient ischemic attack (TIA), and cerebral infarction without residual deficits: Secondary | ICD-10-CM | POA: Diagnosis not present

## 2023-06-08 DIAGNOSIS — E7800 Pure hypercholesterolemia, unspecified: Secondary | ICD-10-CM | POA: Diagnosis not present

## 2023-06-08 DIAGNOSIS — I779 Disorder of arteries and arterioles, unspecified: Secondary | ICD-10-CM | POA: Diagnosis not present

## 2023-07-09 DIAGNOSIS — M65352 Trigger finger, left little finger: Secondary | ICD-10-CM | POA: Diagnosis not present

## 2023-07-19 DIAGNOSIS — H348122 Central retinal vein occlusion, left eye, stable: Secondary | ICD-10-CM | POA: Diagnosis not present

## 2023-08-03 DIAGNOSIS — K080 Exfoliation of teeth due to systemic causes: Secondary | ICD-10-CM | POA: Diagnosis not present

## 2023-08-04 ENCOUNTER — Ambulatory Visit: Payer: Medicare Other | Admitting: Vascular Surgery

## 2023-08-11 ENCOUNTER — Ambulatory Visit: Payer: Medicare Other | Attending: Vascular Surgery | Admitting: Vascular Surgery

## 2023-08-11 ENCOUNTER — Encounter: Payer: Self-pay | Admitting: Vascular Surgery

## 2023-08-11 VITALS — BP 151/89 | HR 69 | Temp 98.4°F | Ht 70.0 in | Wt 197.0 lb

## 2023-08-11 DIAGNOSIS — I6522 Occlusion and stenosis of left carotid artery: Secondary | ICD-10-CM

## 2023-08-11 NOTE — Progress Notes (Signed)
 Patient ID: Larry Holloway, male   DOB: 1951/02/26, 73 y.o.   MRN: [id number]  Reason for Consult: No chief complaint on file.   Referred by Jimmey Mould, MD  Subjective:     HPI:  Larry Holloway is a 73 y.o. male history of known bilateral ICA stenosis greater on the left where he has a history of left sided visual changes.  Initially the carotid stenosis was identified by MRI.  Patient now has returned to aspirin  and Plavix  after having right clavicular fracture where Plavix  was held and all of his right upper extremity symptoms have resolved.  He has completed his semester [employer] school at [employer].  No further visual changes and no imaging performed prior to today's visit.  Past Medical History:  Diagnosis Date   Arthritis    Back   Back pain    Cancer of prostate (HCC)    Carotid artery occlusion    Central serous retinopathy    High blood pressure    High cholesterol    Mild depression    Neck pain    PONV (postoperative nausea and vomiting)    Pre-diabetes    Right arm pain    Family History  Problem Relation Age of Onset   Thyroid disease Mother    Heart disease Mother    Cancer Mother    High blood pressure Mother    Colon polyps Mother    Cancer - Lung Mother    Colon polyps Father    CAD Father    Cancer - Prostate Father    High blood pressure Father    Heart disease Father    COPD Father    CVA Father    Cancer - Prostate Brother    Past Surgical History:  Procedure Laterality Date   ANTERIOR LAT LUMBAR FUSION N/A 08/10/2022   Procedure: Lumbar One-Two, Lumbar Two-Three, Lumbar Three-Four Anterior Lateral Lumbar Fusion;  Surgeon: Elna Haggis, MD;  Location: [facility] OR;  Service: Neurosurgery;  Laterality: N/A;   CATARACT EXTRACTION W/ INTRAOCULAR LENS IMPLANT Bilateral 2022   COLONOSCOPY  2023   FRACTURE SURGERY Right 1975   Right Leg   HIP SURGERY Right 2002   Ball and Socket repaired   NECK SURGERY  2014   Cadaver disc placed in neck    PROSTATE SURGERY     2007    Short Social History:  Social History   Tobacco Use   Smoking status: Former    Current packs/day: 0.00    Types: Cigarettes    Quit date: 1995    Years since quitting: 30.3   Smokeless tobacco: Never   Tobacco comments:    July 1995  Substance Use Topics   Alcohol use: No    Comment: quit Oct.1996    Allergies  Allergen Reactions   Ace Inhibitors Cough   Corticosteroids     Other Reaction(s): Central seroud retinopathy   Tape Swelling   Wound Dressing Adhesive Swelling    Current Outpatient Medications  Medication Sig Dispense Refill   acetaminophen  (TYLENOL ) 500 MG tablet Take 2 tablets (1,000 mg total) by mouth every 6 (six) hours.     ALPRAZolam  (XANAX ) 0.25 MG tablet Take 0.25-0.5 mg by mouth 2 (two) times daily as needed (Restless leg).     amLODipine  (NORVASC ) 5 MG tablet Take 7.5 mg by mouth daily.     Ascorbic Acid (VITAMIN C) 1000 MG tablet Take 1,000 mg by mouth daily.  aspirin  EC 81 MG tablet Take 1 tablet (81 mg total) by mouth daily. Swallow whole. 30 tablet 0   cholecalciferol  (VITAMIN D3) 25 MCG (1000 UNIT) tablet Take 1,000 Units by mouth daily.     docusate sodium  (COLACE) 100 MG capsule Take 1 capsule (100 mg total) by mouth 2 (two) times daily.     DULoxetine  (CYMBALTA ) 60 MG capsule Take 60 mg by mouth daily.     ferrous sulfate 325 (65 FE) MG tablet Take 325 mg by mouth daily with breakfast.     L-Methylfolate 15 MG TABS Take 7.5 mg by mouth daily.     loratadine  (CLARITIN ) 10 MG tablet Take 10 mg by mouth daily as needed for allergies.     methocarbamol  (ROBAXIN ) 500 MG tablet Take 1 tablet (500 mg total) by mouth every 6 (six) hours as needed for muscle spasms. 40 tablet 0   Multiple Vitamins-Minerals (MULTIVITAMIN PO) Take 1 tablet by mouth daily. One a day     omeprazole (PRILOSEC) 20 MG capsule Take 20 mg by mouth daily.     pramipexole  (MIRAPEX ) 0.5 MG tablet Take 0.5 mg by mouth 2 (two) times daily.      rosuvastatin  (CRESTOR ) 40 MG tablet Take 1 tablet (40 mg total) by mouth daily. (Patient taking differently: Take 40 mg by mouth at bedtime.) 90 tablet 2   No current facility-administered medications for this visit.    Review of Systems  Constitutional:  Constitutional negative. HENT: HENT negative.  Eyes: Positive for visual disturbance.   Respiratory: Respiratory negative.  Cardiovascular: Cardiovascular negative.  GI: Gastrointestinal negative.  Musculoskeletal: Musculoskeletal negative.  Skin: Skin negative.  Neurological: Neurological negative. Hematologic: Positive for bruises/bleeds easily.        Objective:  Objective   Vitals:   08/11/23 1214  BP: (!) 151/89  Pulse: 69  Temp: 98.4 F (36.9 C)  SpO2: 97%     Physical Exam HENT:     Head: Normocephalic.     Mouth/Throat:     Mouth: Mucous membranes are moist.  Eyes:     Pupils: Pupils are equal, round, and reactive to light.  Neck:     Vascular: Carotid bruit present.  Cardiovascular:     Rate and Rhythm: Normal rate.     Pulses: Normal pulses.  Pulmonary:     Effort: Pulmonary effort is normal.  Abdominal:     General: Abdomen is flat.  Musculoskeletal:        General: Normal range of motion.     Right lower leg: No edema.     Left lower leg: No edema.  Skin:    General: Skin is warm.     Capillary Refill: Capillary refill takes less than 2 seconds.  Neurological:     General: No focal deficit present.     Mental Status: He is alert.  Psychiatric:        Mood and Affect: Mood normal.     Data: Right Carotid Findings:  +----------+--------+--------+--------+------------------+--------+           PSV cm/sEDV cm/sStenosisPlaque DescriptionComments  +----------+--------+--------+--------+------------------+--------+  CCA Prox  96      18                                          +----------+--------+--------+--------+------------------+--------+  CCA Mid   97      21                                           +----------+--------+--------+--------+------------------+--------+  CCA Distal115     22                                          +----------+--------+--------+--------+------------------+--------+  ICA Prox  157     25      1-39%   calcific                    +----------+--------+--------+--------+------------------+--------+  ICA Mid   55      14                                          +----------+--------+--------+--------+------------------+--------+  ICA Distal53      16                                          +----------+--------+--------+--------+------------------+--------+  ECA      205     24                                          +----------+--------+--------+--------+------------------+--------+   +----------+--------+-------+----------------+-------------------+           PSV cm/sEDV cmsDescribe        Arm Pressure (mmHG)  +----------+--------+-------+----------------+-------------------+  [id number]     4      Multiphasic, [id number]                  +----------+--------+-------+----------------+-------------------+   +---------+--------+--+--------+--+---------+  VertebralPSV cm/s50EDV cm/s11Antegrade  +---------+--------+--+--------+--+---------+      Left Carotid Findings:  +----------+--------+--------+--------+------------------+--------+           PSV cm/sEDV cm/sStenosisPlaque DescriptionComments  +----------+--------+--------+--------+------------------+--------+  CCA Prox  143     23              heterogenous                +----------+--------+--------+--------+------------------+--------+  CCA Mid   143     23              heterogenous                +----------+--------+--------+--------+------------------+--------+  CCA Distal244     63      >50%                                 +----------+--------+--------+--------+------------------+--------+  ICA Prox  322     76      60-79%  calcific                    +----------+--------+--------+--------+------------------+--------+  ICA Mid   60      23                                          +----------+--------+--------+--------+------------------+--------+  ICA Distal51      18                                          +----------+--------+--------+--------+------------------+--------+  ECA      441     77      >50%                                +----------+--------+--------+--------+------------------+--------+   +----------+--------+--------+----------------+-------------------+           PSV cm/sEDV cm/sDescribe        Arm Pressure (mmHG)  +----------+--------+--------+----------------+-------------------+  [id number]    7       Multiphasic, [id number]                  +----------+--------+--------+----------------+-------------------+   +---------+--------+--+--------+--+---------+  VertebralPSV cm/s61EDV cm/s15Antegrade  +---------+--------+--+--------+--+---------+         Summary:  Right Carotid: Velocities in the right ICA are consistent with a 1-39%  stenosis.   Left Carotid: Velocities in the left ICA / bifurcation are consistent with  a               60-79% stenosis with increased velocity in the distal CCA /                bifurcation.. The ECA appears >50% stenosed.   Vertebrals:  Bilateral vertebral arteries demonstrate antegrade flow.  Subclavians: Normal flow hemodynamics were seen in bilateral subclavian               arteries.       Assessment/Plan:     73 year old male with what seems to be asymptomatic moderate stenosis of the left ICA.  We discussed his options again being carotid endarterectomy versus medical therapy.  Patient has elected for medical therapy with aspirin  and Plavix  and statin.  He is not a candidate for stenting due to  the highly calcific nature of the lesion.  Will plan for follow-up in 6 months with repeat carotid duplex.  We discussed the signs and symptoms of stroke he demonstrates good understanding.     Adine Hoof MD [facility]

## 2023-08-13 ENCOUNTER — Other Ambulatory Visit: Payer: Self-pay | Admitting: *Deleted

## 2023-08-13 DIAGNOSIS — I6522 Occlusion and stenosis of left carotid artery: Secondary | ICD-10-CM

## 2023-08-16 DIAGNOSIS — K080 Exfoliation of teeth due to systemic causes: Secondary | ICD-10-CM | POA: Diagnosis not present

## 2023-08-21 ENCOUNTER — Other Ambulatory Visit: Payer: Self-pay | Admitting: Cardiology

## 2023-08-21 DIAGNOSIS — Z79899 Other long term (current) drug therapy: Secondary | ICD-10-CM

## 2023-08-21 DIAGNOSIS — I2510 Atherosclerotic heart disease of native coronary artery without angina pectoris: Secondary | ICD-10-CM

## 2023-08-21 DIAGNOSIS — E782 Mixed hyperlipidemia: Secondary | ICD-10-CM

## 2023-09-22 DIAGNOSIS — H34813 Central retinal vein occlusion, bilateral, with macular edema: Secondary | ICD-10-CM | POA: Diagnosis not present

## 2023-09-22 DIAGNOSIS — I6523 Occlusion and stenosis of bilateral carotid arteries: Secondary | ICD-10-CM | POA: Diagnosis not present

## 2023-09-22 DIAGNOSIS — H3582 Retinal ischemia: Secondary | ICD-10-CM | POA: Diagnosis not present

## 2023-09-22 DIAGNOSIS — H34811 Central retinal vein occlusion, right eye, with macular edema: Secondary | ICD-10-CM | POA: Diagnosis not present

## 2023-09-22 DIAGNOSIS — H34812 Central retinal vein occlusion, left eye, with macular edema: Secondary | ICD-10-CM | POA: Diagnosis not present

## 2023-10-22 DIAGNOSIS — M65342 Trigger finger, left ring finger: Secondary | ICD-10-CM | POA: Diagnosis not present

## 2023-10-25 DIAGNOSIS — M25552 Pain in left hip: Secondary | ICD-10-CM | POA: Diagnosis not present

## 2023-12-01 ENCOUNTER — Other Ambulatory Visit: Payer: Self-pay | Admitting: Orthopedic Surgery

## 2023-12-01 DIAGNOSIS — M65342 Trigger finger, left ring finger: Secondary | ICD-10-CM | POA: Diagnosis not present

## 2023-12-02 ENCOUNTER — Encounter (HOSPITAL_BASED_OUTPATIENT_CLINIC_OR_DEPARTMENT_OTHER): Payer: Self-pay | Admitting: Orthopedic Surgery

## 2023-12-02 NOTE — Progress Notes (Signed)
 RN left detailed VM message with Asberry, Dr. Jetta nurse, that the pt will need cardiac clearance since the pt is taking aspirin  and Plavix .

## 2023-12-03 NOTE — Care Plan (Signed)
 Patient is scheduled to undergo a Left ring finger trigger finger release.  Patient is on aspirin  and Plavix .  The patient is undergoing a low risk procedure with small chance of bleeding complications.  The surgery will also be done with a tourniquet.  There is no contraindication from the orthopedic perspective to hold his aspirin  and Plavix  prior to surgery.

## 2023-12-06 ENCOUNTER — Encounter (HOSPITAL_BASED_OUTPATIENT_CLINIC_OR_DEPARTMENT_OTHER)
Admission: RE | Disposition: A | Discharge status: Home / Self Care | Payer: Self-pay | Source: Home / Self Care | Attending: Orthopedic Surgery

## 2023-12-06 ENCOUNTER — Ambulatory Visit (HOSPITAL_BASED_OUTPATIENT_CLINIC_OR_DEPARTMENT_OTHER): Payer: Self-pay | Admitting: Anesthesiology

## 2023-12-06 ENCOUNTER — Encounter (HOSPITAL_BASED_OUTPATIENT_CLINIC_OR_DEPARTMENT_OTHER): Payer: Self-pay | Admitting: Orthopedic Surgery

## 2023-12-06 ENCOUNTER — Other Ambulatory Visit: Payer: Self-pay

## 2023-12-06 ENCOUNTER — Ambulatory Visit (HOSPITAL_BASED_OUTPATIENT_CLINIC_OR_DEPARTMENT_OTHER)
Admission: RE | Admit: 2023-12-06 | Discharge: 2023-12-06 | Disposition: A | Discharge status: Home / Self Care | Attending: Orthopedic Surgery | Admitting: Orthopedic Surgery

## 2023-12-06 DIAGNOSIS — Z7982 Long term (current) use of aspirin: Secondary | ICD-10-CM | POA: Insufficient documentation

## 2023-12-06 DIAGNOSIS — Z7902 Long term (current) use of antithrombotics/antiplatelets: Secondary | ICD-10-CM | POA: Insufficient documentation

## 2023-12-06 DIAGNOSIS — I10 Essential (primary) hypertension: Secondary | ICD-10-CM

## 2023-12-06 DIAGNOSIS — M1990 Unspecified osteoarthritis, unspecified site: Secondary | ICD-10-CM | POA: Diagnosis not present

## 2023-12-06 DIAGNOSIS — I739 Peripheral vascular disease, unspecified: Secondary | ICD-10-CM | POA: Diagnosis not present

## 2023-12-06 DIAGNOSIS — M65342 Trigger finger, left ring finger: Secondary | ICD-10-CM | POA: Diagnosis not present

## 2023-12-06 DIAGNOSIS — Z87891 Personal history of nicotine dependence: Secondary | ICD-10-CM | POA: Insufficient documentation

## 2023-12-06 DIAGNOSIS — J449 Chronic obstructive pulmonary disease, unspecified: Secondary | ICD-10-CM | POA: Diagnosis not present

## 2023-12-06 DIAGNOSIS — F32A Depression, unspecified: Secondary | ICD-10-CM | POA: Insufficient documentation

## 2023-12-06 DIAGNOSIS — Z419 Encounter for procedure for purposes other than remedying health state, unspecified: Secondary | ICD-10-CM

## 2023-12-06 HISTORY — PX: TRIGGER FINGER RELEASE: SHX641

## 2023-12-06 SURGERY — RELEASE, A1 PULLEY, FOR TRIGGER FINGER
Anesthesia: Monitor Anesthesia Care | Site: Hand | Laterality: Left

## 2023-12-06 MED ORDER — CEFAZOLIN SODIUM-DEXTROSE 2-4 GM/100ML-% IV SOLN
2.0000 g | INTRAVENOUS | Status: AC
  Administered 2023-12-06: 2 g via INTRAVENOUS

## 2023-12-06 MED ORDER — PROPOFOL 500 MG/50ML IV EMUL
INTRAVENOUS | Status: DC | PRN
  Administered 2023-12-06: 100 ug/kg/min via INTRAVENOUS

## 2023-12-06 MED ORDER — LIDOCAINE-EPINEPHRINE (PF) 1 %-1:200000 IJ SOLN
INTRAMUSCULAR | Status: DC | PRN
  Administered 2023-12-06: 10 mL

## 2023-12-06 MED ORDER — SODIUM BICARBONATE 4.2 % IV SOLN
INTRAVENOUS | Status: AC
  Filled 2023-12-06: qty 10

## 2023-12-06 MED ORDER — OXYCODONE HCL 5 MG/5ML PO SOLN: 5.0000 mg | Freq: Once | ORAL | Status: DC | PRN

## 2023-12-06 MED ORDER — PROPOFOL 10 MG/ML IV BOLUS
INTRAVENOUS | Status: DC | PRN
  Administered 2023-12-06: 40 mg via INTRAVENOUS
  Administered 2023-12-06 (×3): 20 mg via INTRAVENOUS

## 2023-12-06 MED ORDER — ONDANSETRON HCL 4 MG/2ML IJ SOLN: 4.0000 mg | Freq: Once | INTRAMUSCULAR | Status: DC | PRN

## 2023-12-06 MED ORDER — ACETAMINOPHEN 10 MG/ML IV SOLN: 1000.0000 mg | Freq: Once | INTRAVENOUS | Status: DC | PRN

## 2023-12-06 MED ORDER — TRAMADOL HCL 50 MG PO TABS: 50.0000 mg | ORAL_TABLET | Freq: Four times a day (QID) | ORAL | 0 refills | Status: AC | PRN

## 2023-12-06 MED ORDER — FENTANYL CITRATE (PF) 100 MCG/2ML IJ SOLN: 25.0000 ug | INTRAMUSCULAR | Status: DC | PRN

## 2023-12-06 MED ORDER — FENTANYL CITRATE (PF) 100 MCG/2ML IJ SOLN
INTRAMUSCULAR | Status: AC
  Filled 2023-12-06: qty 2

## 2023-12-06 MED ORDER — CEFAZOLIN SODIUM-DEXTROSE 2-4 GM/100ML-% IV SOLN
INTRAVENOUS | Status: AC
  Filled 2023-12-06: qty 100

## 2023-12-06 MED ORDER — LACTATED RINGERS IV SOLN: INTRAVENOUS | Status: DC

## 2023-12-06 MED ORDER — ONDANSETRON HCL 4 MG/2ML IJ SOLN
INTRAMUSCULAR | Status: AC
  Filled 2023-12-06: qty 2

## 2023-12-06 MED ORDER — ONDANSETRON HCL 4 MG/2ML IJ SOLN
INTRAMUSCULAR | Status: DC | PRN
  Administered 2023-12-06: 4 mg via INTRAVENOUS

## 2023-12-06 MED ORDER — OXYCODONE HCL 5 MG PO TABS: 5.0000 mg | ORAL_TABLET | Freq: Once | ORAL | Status: DC | PRN

## 2023-12-06 MED ORDER — LIDOCAINE-EPINEPHRINE (PF) 1 %-1:200000 IJ SOLN
INTRAMUSCULAR | Status: AC
  Filled 2023-12-06: qty 30

## 2023-12-06 MED ORDER — ONDANSETRON 4 MG PO TBDP: 4.0000 mg | ORAL_TABLET | Freq: Three times a day (TID) | ORAL | 0 refills | Status: AC | PRN

## 2023-12-06 SURGICAL SUPPLY — 29 items
BLADE SURG 15 STRL LF DISP TIS (BLADE) ×1 IMPLANT
BNDG COHESIVE 1X5 TAN STRL LF (GAUZE/BANDAGES/DRESSINGS) IMPLANT
BNDG COMPR ESMARK 4X3 LF (GAUZE/BANDAGES/DRESSINGS) ×1 IMPLANT
CHLORAPREP W/TINT 26 (MISCELLANEOUS) ×1 IMPLANT
COVER BACK TABLE 60X90IN (DRAPES) ×1 IMPLANT
COVER MAYO STAND STRL (DRAPES) ×1 IMPLANT
CUFF TOURN SGL QUICK 18X4 (TOURNIQUET CUFF) IMPLANT
DRAPE EXTREMITY T 121X128X90 (DISPOSABLE) ×1 IMPLANT
DRAPE SURG 17X23 STRL (DRAPES) ×1 IMPLANT
GAUZE SPONGE 4X4 12PLY STRL LF (GAUZE/BANDAGES/DRESSINGS) ×1 IMPLANT
GAUZE STRETCH 2X75IN STRL (MISCELLANEOUS) ×1 IMPLANT
GAUZE XEROFORM 1X8 LF (GAUZE/BANDAGES/DRESSINGS) ×1 IMPLANT
GLOVE BIO SURGEON STRL SZ7.5 (GLOVE) ×1 IMPLANT
GLOVE BIO SURGEON STRL SZ8 (GLOVE) ×1 IMPLANT
GLOVE BIOGEL PI IND STRL 8 (GLOVE) ×1 IMPLANT
GOWN STRL REUS W/ TWL LRG LVL3 (GOWN DISPOSABLE) ×1 IMPLANT
GOWN STRL REUS W/TWL XL LVL3 (GOWN DISPOSABLE) ×1 IMPLANT
NDL HYPO 25X1 1.5 SAFETY (NEEDLE) IMPLANT
NDL SAFETY ECLIPSE 18X1.5 (NEEDLE) IMPLANT
NEEDLE HYPO 25X1 1.5 SAFETY (NEEDLE) IMPLANT
NS IRRIG 1000ML POUR BTL (IV SOLUTION) ×1 IMPLANT
PACK BASIN DAY SURGERY FS (CUSTOM PROCEDURE TRAY) ×1 IMPLANT
STOCKINETTE 6 STRL (DRAPES) ×1 IMPLANT
SUT ETHILON 3 0 PS 1 (SUTURE) IMPLANT
SUT VICRYL RAPIDE 4/0 PS 2 (SUTURE) IMPLANT
SYR 10ML LL (SYRINGE) IMPLANT
SYR BULB EAR ULCER 3OZ GRN STR (SYRINGE) ×1 IMPLANT
TOWEL GREEN STERILE FF (TOWEL DISPOSABLE) ×1 IMPLANT
UNDERPAD 30X36 HEAVY ABSORB (UNDERPADS AND DIAPERS) ×1 IMPLANT

## 2023-12-06 NOTE — Discharge Instructions (Addendum)
 SABRA Adine Mon, MD [facility] Orthopaedic and Sports Medicine [facility] [address] [place], [place] [address] [phone]   POST-OPERATIVE INSTRUCTIONS   WOUND CARE Please keep dressing clean dry and intact until for 5-7 days. You may remove the dressing and cover the incision with a Band-Aid after that time You must keep dressing dry during this process and may find that a plastic bag taped around the extremity or alternatively a towel based bath may be a better option.    EXERCISES You may use your hand for normal everyday activities but should avoid any strenuous lifting or grabbing It is normal for your fingers/hand to become more swollen after surgery and discolored from bruising.   This will resolve over the first few weeks usually after surgery. Please continue to ambulate and do not stay sitting or lying for too long.  Perform foot and wrist pumps to assist in circulation.   REGIONAL ANESTHESIA (NERVE BLOCKS) The anesthesia team may have performed a nerve block for you this is a great tool used to minimize pain.   The block may start wearing off overnight (between 8-24 hours postop) When the block wears off, your pain may go from nearly zero to the pain you would have had postop without the block. This is an abrupt transition but nothing dangerous is happening.   This can be a challenging period but utilize your as needed pain medications to try and manage this period. We suggest you use the pain medication the first night prior to going to bed, to ease this transition.  You may take an extra dose of narcotic when this happens if needed   POST-OP MEDICATIONS- Multimodal approach to pain control In general your pain will be controlled with a combination of substances.  Prescriptions unless otherwise discussed are electronically sent to your pharmacy.  This is a carefully made plan we use to minimize narcotic use.     Ibuprofen or Naproxen- Anti-inflammatory medication taken on a  scheduled basis every 6-8 hours for 3-5 days Acetaminophen  - Non-narcotic pain medicine taken on a scheduled basis every 6-8 hours for 3-5 days Tramadol - This is a strong narcotic, to be used only on an "as needed" basis for SEVERE pain. Zofran  - take as needed for nausea  FOLLOW-UP If you develop a Fever (>101.5), Redness or Drainage from the surgical incision site, please call our office to arrange for an evaluation. Please call the office to schedule a follow-up appointment for your incision check if you do not already have one, 7-10 days post-operatively.   HELPFUL INFORMATION    You may be more comfortable sleeping in a semi-seated position the first few nights following surgery.  Keep a pillow propped under the elbow and forearm for comfort.  If you have a recliner type of chair it might be beneficial.  If not that is fine too, but it would be helpful to sleep propped up with pillows behind your operated shoulder as well under your elbow and forearm.  This will reduce pulling on the suture lines.   When dressing, put your operative arm in the sleeve first.  When getting undressed, take your operative arm out last.  Loose fitting, button-down shirts are recommended.  Often in the first days after surgery you may be more comfortable keeping your operative arm under your shirt and not through the sleeve.   You may return to work/school in the next couple of days when you feel up to it.  Desk work and typing  in the sling is fine.   We suggest you use the pain medication the first night prior to going to bed, in order to ease any pain when the anesthesia wears off. You should avoid taking pain medications on an empty stomach as it will make you nauseous.   You should wean off your narcotic medicines as soon as you are able.  Most patients will be off narcotics before their first postop appointment.    Do not drink alcoholic beverages or take illicit drugs when taking pain medications.   It  is against the law to drive while taking narcotics.  In some states it is against the law to drive while your arm is in a sling.    Pain medication may make you constipated.  Below are a few solutions to try in this order:   - Decrease the amount of pain medication if you aren't having pain.   - Drink lots of decaffeinated fluids.   - Drink prune juice and/or each dried prunes   If the first 3 don't work start with additional solutions   - Take Colace - an over-the-counter stool softener   - Take Senokot - an over-the-counter laxative   - Take Miralax  - a stronger over-the-counter laxative    Post Anesthesia Home Care Instructions  Activity: Get plenty of rest for the remainder of the day. A responsible individual must stay with you for 24 hours following the procedure.  For the next 24 hours, DO NOT: -Drive a car -Advertising copywriter -Drink alcoholic beverages -Take any medication unless instructed by your physician -Make any legal decisions or sign important papers.  Meals: Start with liquid foods such as gelatin or soup. Progress to regular foods as tolerated. Avoid greasy, spicy, heavy foods. If nausea and/or vomiting occur, drink only clear liquids until the nausea and/or vomiting subsides. Call your physician if vomiting continues.  Special Instructions/Symptoms: Your throat may feel dry or sore from the anesthesia or the breathing tube placed in your throat during surgery. If this causes discomfort, gargle with warm salt water. The discomfort should disappear within 24 hours.  If you had a scopolamine patch placed behind your ear for the management of post- operative nausea and/or vomiting:  1. The medication in the patch is effective for 72 hours, after which it should be removed.  Wrap patch in a tissue and discard in the trash. Wash hands thoroughly with soap and water. 2. You may remove the patch earlier than 72 hours if you experience unpleasant side effects which may  include dry mouth, dizziness or visual disturbances. 3. Avoid touching the patch. Wash your hands with soap and water after contact with the patch.

## 2023-12-06 NOTE — Transfer of Care (Signed)
 Immediate Anesthesia Transfer of Care Note  Patient: Larry Holloway  Procedure(s) Performed: RELEASE, A1 PULLEY, FOR TRIGGER FINGER (Left: Hand)  Patient Location: PACU  Anesthesia Type:MAC  Level of Consciousness: drowsy, patient cooperative, and responds to stimulation  Airway & Oxygen Therapy: Patient Spontanous Breathing and Patient connected to face mask oxygen  Post-op Assessment: Report given to RN and Post -op Vital signs reviewed and stable  Post vital signs: Reviewed and stable  Last Vitals:  Vitals Value Taken Time  BP    Temp    Pulse 75 12/06/23 09:31  Resp 20 12/06/23 09:31  SpO2 99 % 12/06/23 09:31  Vitals shown include unfiled device data.  Last Pain:  Vitals:   12/06/23 0738  TempSrc: Temporal  PainSc: 0-No pain      Patients Stated Pain Goal: 5 (12/06/23 9261)  Complications: No notable events documented.

## 2023-12-06 NOTE — Op Note (Signed)
.  Orthopaedic Surgery Operative Note (CSN: [id number])  Larry Holloway  April 29, 1950 Date of Surgery: 12/06/2023   Diagnoses:  LEFT RING TRIGGER FINGER  Procedure: Left ring finger A1 pulley release (CPT [id number])   Operative Finding Successful completion of the planned procedure.  Stenosis of the A1 pulley  Post-operative plan: The patient will be weight bearing as tolerated in the left hand for activities of daily living.  DVT prophylaxis not indicated in this ambulatory upper extremity patient without significant risk factors.   Pain control with PRN pain medication preferring oral medicines.  Follow up plan will be scheduled in approximately 10-14 days for incision check.  Post-Op Diagnosis: Same Surgeons:Primary: Sherida Adine BROCKS, MD Assistants:None Location: [facility] OR ROOM 8 Anesthesia: Sedation plus regional anesthesia Antibiotics: Ancef  2 g Tourniquet time:  Total Tourniquet Time Documented: Forearm (Left) - 17 minutes Total: Forearm (Left) - 17 minutes  Estimated Blood Loss: Minimal Complications: None Specimens: None Implants: * No implants in log *  Indications for Surgery:   Larry Holloway is a 73 y.o. male with a left ring finger trigger finger that had not improved with non operative treatment. He was indicated for a left ringer finger A1 pulley release.  Risks and benefits of surgery were discussed with patient and all questions answered. These risks include but are not limited to infection, bleeding, damage to neurovascular structures, stiffness, wound healing problems, continued pain, failure to have resolution of symptoms, need for additional surgeries and cardiopulmonary complications from anesthesia. The patient understands all of this and wishes to proceed with surgery.   Procedure:   After receiving prophylactic antibiotics, the patient was escorted to the operative theatre and placed in a supine position.   A surgical "time-out" was performed during which the  planned procedure, proposed operative site, and the correct patient identity were compared to the operative consent and agreement confirmed by the circulating nurse according to current facility policy.  Digital block was performed with lidocaine  bearing epinephrine . Following application of a tourniquet to the operative extremity, the exposed skin was prepped with Chloraprep and draped in the usual sterile fashion.  The limb was exsanguinated with an Esmarch bandage and the tourniquet inflated to 250 mmHg.   An oblique incision was made over the A1 pulley of the affected digit.  Subcutaneous taste tissues were dissected with blunt and spreading dissection to reveal an underlying flexor tendon sheath and A1 pulley.  With the neurovascular structures protected, the A1 pulley was split in the midline under direct visualization. Some crossing bands proximal to the A1 pulley were also released.  The tendons were pulled into view, cleaned of thickened synovium and return to their bed. The patient was able to flex and extend their digit without any triggering present. The wound was irrigated, tourniquet released, and skin closed with 3-0 Nylon sutures.  A light dressing was applied and the patient was taken to the recovery room.

## 2023-12-06 NOTE — Anesthesia Procedure Notes (Signed)
 Procedure Name: MAC Date/Time: 12/06/2023 8:53 AM  Performed by: Frost Kayla MATSU, CRNAPre-anesthesia Checklist: Patient identified, Emergency Drugs available, Suction available and Patient being monitored Patient Re-evaluated:Patient Re-evaluated prior to induction Oxygen Delivery Method: Simple face mask

## 2023-12-06 NOTE — Anesthesia Postprocedure Evaluation (Signed)
 Anesthesia Post Note  Patient: Larry Holloway  Procedure(s) Performed: RELEASE, A1 PULLEY, FOR TRIGGER FINGER (Left: Hand)     Patient location during evaluation: PACU Anesthesia Type: MAC Level of consciousness: awake and alert Pain management: pain level controlled Vital Signs Assessment: post-procedure vital signs reviewed and stable Respiratory status: spontaneous breathing, nonlabored ventilation, respiratory function stable and patient connected to nasal cannula oxygen Cardiovascular status: stable and blood pressure returned to baseline Postop Assessment: no apparent nausea or vomiting Anesthetic complications: no   No notable events documented.  Last Vitals:  Vitals:   12/06/23 0945 12/06/23 1009  BP: 138/73 (!) 166/81  Pulse: 75 74  Resp: (!) 21 20  Temp:  36.6 C  SpO2: 95% 99%    Last Pain:  Vitals:   12/06/23 1009  TempSrc: Temporal  PainSc: 0-No pain                 Rome Ade

## 2023-12-06 NOTE — H&P (Signed)
 Chief Complaint: left ring trigger finger HPI: Larry Holloway is an 73 y.o. male with a left ring trigger finger. He has not had resolution with non operative treatments including stretching, anti-inflammatory medications and corticosteroid injections.  Past Medical History:  Diagnosis Date   Arthritis    Back   Back pain    Cancer of prostate (HCC)    Carotid artery occlusion    Central serous retinopathy    High blood pressure    High cholesterol    Mild depression    Neck pain    PONV (postoperative nausea and vomiting)    Pre-diabetes    Right arm pain     Past Surgical History:  Procedure Laterality Date   ANTERIOR LAT LUMBAR FUSION N/A 08/10/2022   Procedure: Lumbar One-Two, Lumbar Two-Three, Lumbar Three-Four Anterior Lateral Lumbar Fusion;  Surgeon: Colon Shove, MD;  Location: [facility] OR;  Service: Neurosurgery;  Laterality: N/A;   CATARACT EXTRACTION W/ INTRAOCULAR LENS IMPLANT Bilateral 2022   COLONOSCOPY  2023   FRACTURE SURGERY Right 1975   Right Leg   HIP SURGERY Right 2002   Ball and Socket repaired   NECK SURGERY  2014   Cadaver disc placed in neck   PROSTATE SURGERY     2007    Family History  Problem Relation Age of Onset   Thyroid disease Mother    Heart disease Mother    Cancer Mother    High blood pressure Mother    Colon polyps Mother    Cancer - Lung Mother    Colon polyps Father    CAD Father    Cancer - Prostate Father    High blood pressure Father    Heart disease Father    COPD Father    CVA Father    Cancer - Prostate Brother    Social History:  reports that he quit smoking about 30 years ago. His smoking use included cigarettes. He has never used smokeless tobacco. He reports that he does not drink alcohol and does not use drugs.  Allergies:  Allergies  Allergen Reactions   Ace Inhibitors Cough   Corticosteroids     Other Reaction(s): Central seroud retinopathy   Tape Swelling   Wound Dressing Adhesive Swelling     Medications Prior to Admission  Medication Sig Dispense Refill   acetaminophen  (TYLENOL ) 500 MG tablet Take 2 tablets (1,000 mg total) by mouth every 6 (six) hours.     amLODipine  (NORVASC ) 5 MG tablet Take 7.5 mg by mouth daily.     Ascorbic Acid (VITAMIN C) 1000 MG tablet Take 1,000 mg by mouth daily.     aspirin  EC 81 MG tablet Take 1 tablet (81 mg total) by mouth daily. Swallow whole. 30 tablet 0   cholecalciferol  (VITAMIN D3) 25 MCG (1000 UNIT) tablet Take 1,000 Units by mouth daily.     clopidogrel  (PLAVIX ) 75 MG tablet Take 75 mg by mouth daily.     DULoxetine  (CYMBALTA ) 60 MG capsule Take 60 mg by mouth daily.     ferrous sulfate 325 (65 FE) MG tablet Take 325 mg by mouth daily with breakfast.     L-Methylfolate 15 MG TABS Take 7.5 mg by mouth daily.     loratadine  (CLARITIN ) 10 MG tablet Take 10 mg by mouth daily as needed for allergies.     Multiple Vitamins-Minerals (MULTIVITAMIN PO) Take 1 tablet by mouth daily. One a day     omeprazole (PRILOSEC) 20 MG capsule Take 20  mg by mouth daily.     pramipexole  (MIRAPEX ) 0.5 MG tablet Take 0.5 mg by mouth 2 (two) times daily.     rosuvastatin  (CRESTOR ) 40 MG tablet TAKE 1 TABLET(40 MG) BY MOUTH DAILY 90 tablet 1   ALPRAZolam  (XANAX ) 0.25 MG tablet Take 0.25-0.5 mg by mouth 2 (two) times daily as needed (Restless leg).      No results found for this or any previous visit (from the past 48 hours). No results found.  Review of Systems  Constitutional: Negative.   HENT: Negative.    Eyes: Negative.   Respiratory: Negative.    Cardiovascular: Negative.   Genitourinary: Negative.   Musculoskeletal:  Positive for arthralgias.  Neurological: Negative.   Hematological: Negative.   Psychiatric/Behavioral: Negative.      Blood pressure (!) 154/86, pulse 69, temperature 98.2 F (36.8 C), temperature source Temporal, resp. rate 15, height 5' 11 (1.803 m), weight 90.4 kg, SpO2 98%. Physical Exam Constitutional:      Appearance:  Normal appearance.  HENT:     Head: Normocephalic and atraumatic.     Mouth/Throat:     Mouth: Mucous membranes are moist.  Eyes:     Extraocular Movements: Extraocular movements intact.     Pupils: Pupils are equal, round, and reactive to light.  Cardiovascular:     Rate and Rhythm: Normal rate and regular rhythm.     Pulses: Normal pulses.     Heart sounds: Normal heart sounds.  Pulmonary:     Effort: Pulmonary effort is normal.     Breath sounds: Normal breath sounds.  Abdominal:     General: Abdomen is flat.     Palpations: Abdomen is soft.  Musculoskeletal:     Comments: Left hand Tender to palpation over ring finger A1 pulley Palpable triggering present at A1 pulley Full ROM of digit Neurovascularly intact  Neurological:     Mental Status: He is alert.      Assessment/Plan 73 year old male with a left ring trigger finger  Plan for left ring finger A1 pulley release.  Risks and benefits of surgery were discussed with patient and all questions answered. These risks include but are not limited to infection, bleeding, damage to neurovascular structures, stiffness, wound healing problems, continued pain, failure to have resolution of symptoms, need for additional surgeries and cardiopulmonary complications from anesthesia. The patient understands all of this and wishes to proceed with surgery.  Adine JAYSON Mon, MD 12/06/2023, 7:54 AM

## 2023-12-06 NOTE — Anesthesia Preprocedure Evaluation (Addendum)
 Anesthesia Evaluation  Patient identified by MRN, date of birth, ID band Patient awake    Reviewed: Allergy & Precautions, NPO status , Patient's Chart, lab work & pertinent test results  History of Anesthesia Complications (+) PONV and history of anesthetic complications  Airway Mallampati: II  TM Distance: >3 FB Neck ROM: Full    Dental no notable dental hx. (+) Teeth Intact   Pulmonary neg sleep apnea, neg COPD, Patient abstained from smoking.Not current smoker, former smoker   Pulmonary exam normal breath sounds clear to auscultation       Cardiovascular Exercise Tolerance: Good METShypertension, Pt. on medications + Peripheral Vascular Disease  (-) CAD and (-) Past MI (-) dysrhythmias + Valvular Problems/Murmurs MVP  Rhythm:Regular Rate:Normal - Systolic murmurs Carotid duplex: Right Carotid: Velocities in the right ICA are consistent with a 1-39%  stenosis.   Left Carotid: Velocities in the left ICA / bifurcation are consistent with  a               60-79% stenosis with increased velocity in the distal CCA /                bifurcation.. The ECA appears >50% stenosed.     1. Left ventricular ejection fraction, by estimation, is 60 to 65%. The  left ventricle has normal function. The left ventricle has no regional  wall motion abnormalities. Left ventricular diastolic parameters are  consistent with Grade I diastolic  dysfunction (impaired relaxation). The average left ventricular global  longitudinal strain is -23.8 %. The global longitudinal strain is normal.   2. Right ventricular systolic function is normal. The right ventricular  size is mildly enlarged.   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 No  evidence of mitral stenosis. There is moderate holosystolic prolapse of  the middle scallop of the posterior leaflet of the mitral valve.   4. The aortic valve is tricuspid. Aortic valve  regurgitation is not  visualized. Aortic valve sclerosis/calcification is present, without any  evidence of aortic stenosis.   5. Aortic dilatation noted. There is mild dilatation of the aortic root,  measuring 38 mm. There is mild dilatation of the ascending aorta,  measuring 41 mm.   6. The inferior vena cava is normal in size with greater than 50%  respiratory variability, suggesting right atrial pressure of 3 mmHg.     Neuro/Psych  PSYCHIATRIC DISORDERS  Depression    negative neurological ROS     GI/Hepatic ,neg GERD  ,,(+)     (-) substance abuse    Endo/Other  neg diabetes    Renal/GU negative Renal ROS     Musculoskeletal  (+) Arthritis ,    Abdominal   Peds  Hematology   Anesthesia Other Findings Past Medical History: No date: Arthritis     Comment:  Back No date: Back pain No date: Cancer of prostate (HCC) No date: Carotid artery occlusion No date: Central serous retinopathy No date: High blood pressure No date: High cholesterol No date: Mild depression No date: Neck pain No date: PONV (postoperative nausea and vomiting) No date: Pre-diabetes No date: Right arm pain  Reproductive/Obstetrics                              Anesthesia Physical Anesthesia Plan  ASA: 3  Anesthesia Plan: MAC   Post-op Pain Management: Minimal or no pain anticipated   Induction: Intravenous  PONV Risk  Score and Plan: 2 and Propofol  infusion, TIVA and Ondansetron   Airway Management Planned: Nasal Cannula  Additional Equipment: None  Intra-op Plan:   Post-operative Plan:   Informed Consent: I have reviewed the patients History and Physical, chart, labs and discussed the procedure including the risks, benefits and alternatives for the proposed anesthesia with the patient or authorized representative who has indicated his/her understanding and acceptance.     Dental advisory given  Plan Discussed with: CRNA and Surgeon  Anesthesia  Plan Comments: (Discussed risks of anesthesia with patient, including possibility of difficulty with spontaneous ventilation under anesthesia necessitating airway intervention, PONV, and rare risks such as cardiac or respiratory or neurological events, and allergic reactions. Discussed the role of CRNA in patient's perioperative care. Patient understands.)         Anesthesia Quick Evaluation

## 2023-12-07 ENCOUNTER — Encounter (HOSPITAL_BASED_OUTPATIENT_CLINIC_OR_DEPARTMENT_OTHER): Payer: Self-pay | Admitting: Orthopedic Surgery

## 2023-12-09 NOTE — Progress Notes (Signed)
 Retina Clinic Note 12/13/2023      CHIEF COMPLAINT Patient presents for Retinal Injection (Pt states vision starting to tapers off since last visit. )   HISTORY OF PRESENT ILLNESS: Larry Holloway is a 73 y.o. male who presents to the clinic today for:   HPI     Retinal Injection   In left eye (Avastin ). Additional comments: Pt states vision starting to tapers off since last visit.       Last edited by Clint Felicita Mae on 12/13/2023 10:50 AM.      HISTORICAL INFORMATION:   Selected notes from the MEDICAL RECORD NUMBER    CURRENT MEDICATIONS: Medications Ordered Prior to Encounter[1]  Referring physician: Jordan Arthur Ueberroth, MD [address] [place],  [place] [address]  REVIEW OF SYSTEMS: No notes on file  ALLERGIES Allergies[2]  PAST MEDICAL HISTORY Medical History[3] Surgical History[4]  FAMILY HISTORY Family History[5]  SOCIAL HISTORY Social History[6]      GENERAL EXAM: General Exam: Neuro: Alert and Oriented x 3, normal mood and affect   OPHTHALMIC EXAM:  Base Eye Exam     Visual Acuity (Snellen - Linear)       Right Left   Dist [place] 20/30 20/50 -1         Tonometry (Tonopen: Tetracaine OU, 10:56 AM)       Right Left   Pressure 17 18         Pupils       Dark Light Shape APD   Right 5 3 Round None   Left 5 3 Round None         Neuro/Psych     Oriented x3: Yes   Mood/Affect: Normal         Dilation     Both eyes: 2.5% Phenylephrine , 0.5% Proparacaine, 1.0% Tropicamide @ 10:56 AM           Slit Lamp and Fundus Exam     External Exam       Right Left   External Normal Normal         Slit Lamp Exam       Right Left   Lids/Lashes Normal Normal   Conjunctiva/Sclera White and quiet White and quiet   Cornea Clear Clear   Anterior Chamber Deep and quiet Deep and quiet   Iris dilated dilated, no nvi   Lens pciol pciol   Anterior Vitreous Normal Normal         Fundus Exam       Right Left    Disc  No nvd   Macula  attached   Vessels  slight dilation/tortuosity   Periphery  Dbh 360, more prominent superiorly and temporally            IMAGING AND PROCEDURES  Imaging and Procedures for Mon 12/13/2023:  Testing report: Optical coherence tomography Date obtained - Mon 12/13/2023 Indication for imaging: crvo Technically adequate study. Patient compliance high.   Right Eye:  Central foveal thickness: 277 Findings: serous ped, dry  Comparison to previous: stable   Left Eye:  Central foveal thickness: 286 Findings: cme Comparison to previous: worse  Diagnosis / Impression: see below  Clinical management:  See below    Prior Imaging and Procedures:        ASSESSMENT/PLAN:  1. 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 Central retinal vein occlusion with macular edema of right eye (CMD)        Ophthalmic Meds Ordered this visit:  New Medications Ordered This Visit  Medications  . bevacizumab  (AVASTIN ) intravitreal syringe 1.25 mg    This is a high-cost/low-use medication   [employer]: Must obtain from the central repository at [facility].  M-F 8am-4pm [phone]   M-F 4pm-8am, Sa/Su [phone]   [place]: If not stocked, contact Purchasing Team immediately.  May take up to 48h to obtain.     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  - IV FA (04/11/13) shows markedly delayed fill and temporal ischemia. Exam with IRHs and OCT with IRF.  - s/p PRP (05/10/23) - Evaluated by vascular service who will have patient on blood thinner and statin and monitor (07/2023) - Today, 12/09/2023, symptomatic new CME OS. I will  proceed with IVA left eye today. Tighten back to 10 week interval.    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  There are no Patient Instructions on file for this visit.   ==========================   Jordan A. Ueberroth, MD Vitreoretinal Service Ophthalmology [facility]       [1] Current Outpatient Medications on File Prior to Visit  Medication Sig Dispense Refill  . ALPRAZolam  (XANAX ) 0.25 mg tablet     . amLODIPine  (NORVASC ) 5 mg tablet Take 5 mg by mouth Once Daily.    . DULoxetine  (CYMBALTA ) 60 mg capsule     . loratadine  (CLARITIN ) 10 mg tablet Take 10 mg by mouth Once Daily.    . methylfolate (DEPLIN) 7.5 mg tablet Take  by mouth Once Daily.    . multivitamin (Flintstones/Extra C) chewable tablet Take  by mouth.    SABRA omeprazole (PriLOSEC) 20 mg DR capsule     . pramipexole  (MIRAPEX ) 0.125 mg tablet TAKE 1 TO 4 TABLETS BY MOUTH BEFORE BEDTIME AND 1 TO 2 TABLETS IN THE AFTERNOON ONCE A DAY FOR 90 DAYS    . rosuvastatin  (CRESTOR ) 20 mg tablet      No current facility-administered medications on file prior to visit.  [2] Allergies Allergen Reactions  . Adhesive Swelling  [3] Past Medical History: Diagnosis Date  . Arthritis   . Hypertension   . Hypertension   . Prostate cancer    (CMD)   [4] Past Surgical History: Procedure Laterality Date  . BACK SURGERY  08/10/2022  . BACK SURGERY  08/15/2022  . FEMUR SURGERY     Procedure: FEMUR SURGERY  . NECK SURGERY     Procedure: NECK SURGERY  . OTHER SURGICAL HISTORY     Procedure: OTHER SURGICAL HISTORY (osteomyelitis)  . PROSTATECTOMY     Procedure: PROSTATECTOMY  . TIBIA FRACTURE SURGERY     Procedure: TIBIA FRACTURE SURGERY  . TOTAL HIP ARTHROPLASTY Left 09/2022  [5] Family History Problem Relation Name Age of  Onset  . Cancer Father    . Hypertension Father    . Stroke Father    . Diabetes type II Sister    .  Cancer Brother    [6] Social History Tobacco Use  . Smoking status: Former  . Smokeless tobacco: Never  Vaping Use  . Vaping status: Never Used  Substance Use Topics  . Alcohol use: No

## 2023-12-13 DIAGNOSIS — H34813 Central retinal vein occlusion, bilateral, with macular edema: Secondary | ICD-10-CM | POA: Diagnosis not present

## 2023-12-13 DIAGNOSIS — H353112 Nonexudative age-related macular degeneration, right eye, intermediate dry stage: Secondary | ICD-10-CM | POA: Diagnosis not present

## 2023-12-13 DIAGNOSIS — H34811 Central retinal vein occlusion, right eye, with macular edema: Secondary | ICD-10-CM | POA: Diagnosis not present

## 2023-12-13 DIAGNOSIS — H34812 Central retinal vein occlusion, left eye, with macular edema: Secondary | ICD-10-CM | POA: Diagnosis not present

## 2023-12-13 DIAGNOSIS — I998 Other disorder of circulatory system: Secondary | ICD-10-CM | POA: Diagnosis not present

## 2023-12-27 DIAGNOSIS — Z23 Encounter for immunization: Secondary | ICD-10-CM | POA: Diagnosis not present

## 2023-12-27 DIAGNOSIS — I10 Essential (primary) hypertension: Secondary | ICD-10-CM | POA: Diagnosis not present

## 2023-12-27 DIAGNOSIS — G2581 Restless legs syndrome: Secondary | ICD-10-CM | POA: Diagnosis not present

## 2023-12-27 DIAGNOSIS — R7301 Impaired fasting glucose: Secondary | ICD-10-CM | POA: Diagnosis not present

## 2023-12-27 DIAGNOSIS — F324 Major depressive disorder, single episode, in partial remission: Secondary | ICD-10-CM | POA: Diagnosis not present

## 2023-12-27 DIAGNOSIS — Z8546 Personal history of malignant neoplasm of prostate: Secondary | ICD-10-CM | POA: Diagnosis not present

## 2023-12-27 DIAGNOSIS — E7800 Pure hypercholesterolemia, unspecified: Secondary | ICD-10-CM | POA: Diagnosis not present

## 2024-01-17 DIAGNOSIS — F324 Major depressive disorder, single episode, in partial remission: Secondary | ICD-10-CM | POA: Diagnosis not present

## 2024-01-17 DIAGNOSIS — G2581 Restless legs syndrome: Secondary | ICD-10-CM | POA: Diagnosis not present

## 2024-01-17 DIAGNOSIS — R7303 Prediabetes: Secondary | ICD-10-CM | POA: Diagnosis not present

## 2024-01-31 ENCOUNTER — Encounter: Payer: Self-pay | Admitting: Cardiology

## 2024-01-31 ENCOUNTER — Ambulatory Visit: Attending: Cardiology | Admitting: Cardiology

## 2024-01-31 VITALS — BP 142/80 | HR 71 | Ht 71.0 in | Wt 205.4 lb

## 2024-01-31 DIAGNOSIS — R001 Bradycardia, unspecified: Secondary | ICD-10-CM | POA: Diagnosis not present

## 2024-01-31 DIAGNOSIS — I2510 Atherosclerotic heart disease of native coronary artery without angina pectoris: Secondary | ICD-10-CM | POA: Diagnosis not present

## 2024-01-31 DIAGNOSIS — I10 Essential (primary) hypertension: Secondary | ICD-10-CM

## 2024-01-31 DIAGNOSIS — I77810 Thoracic aortic ectasia: Secondary | ICD-10-CM | POA: Insufficient documentation

## 2024-01-31 DIAGNOSIS — I6522 Occlusion and stenosis of left carotid artery: Secondary | ICD-10-CM | POA: Insufficient documentation

## 2024-01-31 DIAGNOSIS — E782 Mixed hyperlipidemia: Secondary | ICD-10-CM | POA: Insufficient documentation

## 2024-01-31 NOTE — Progress Notes (Signed)
 Cardiology Office Note:  .   Date:  01/31/2024  ID:  Larry Holloway, DOB 12/16/1950, MRN [id number] PCP: Larry Carlin Redbird, MD  [facility] Providers Cardiologist:  Newman Lawrence, MD PCP: Larry Carlin Redbird, MD  Chief Complaint  Patient presents with   Hypertension   Bradycardia      History of Present Illness: .    Larry Holloway is a 73 y.o. 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  However, MRI did not show any definite evidence of stroke.  That said, he did have left carotid stenosis of moderate severity, which is being monitored by Dr. Sheree.  There has been discussion regarding carotid endarterectomy, but patient wanted to wait until he finished his last semester at [employer] of [place].  His blood pressure is elevated today, generally lower than this.  Recently, amlodipine  was changed to amlodipine -valsartan by his PCP.  Reviewed recent lab results with the patient, details.  Vitals:   01/31/24 1027 01/31/24 1057  BP: (!) 170/70 (!) 142/80  Pulse: 71   SpO2: 98%       ROS:  Review of Systems  Cardiovascular:  Negative for chest pain, dyspnea on exertion, leg swelling, palpitations and syncope.  Neurological:  Positive for light-headedness.     Studies Reviewed: SABRA       EKG 01/31/2024: Normal sinus rhythm Incomplete right bundle branch block Cannot rule out Anterior infarct , age undetermined When compared with ECG of 28-Mar-2023 02:52, No significant change was found  Carotid duplex 03/2023: Right Carotid: Velocities in the right ICA are consistent with a 1-39% stenosis.  Left Carotid: Velocities in the left ICA / bifurcation are consistent with a 60-79% stenosis with increased velocity in the distal CCA /  bifurcation.. The ECA appears >50% stenosed.  Vertebrals:  Bilateral vertebral  arteries demonstrate antegrade flow.  Subclavians: Normal flow hemodynamics were seen in bilateral subclavian              arteries.   CTA head/neck 02/2023: 1. No acute intracranial abnormality. 2. No large vessel occlusion. 3. Extensive, predominantly calcified plaque results in approximately 75% stenosis of the distal left CCA. 4. Moderate stenosis of the left vertebral artery origin and severe stenosis of the right vertebral artery origin. 5. Unchanged comminuted fracture of the proximal right clavicle with anterosuperior displacement of the distal fracture fragment. Unchanged associated hematoma in the right sternocleidomastoid muscle.   MRA head 01/2023: 1. No acute intracranial abnormality. 2. Normal intracranial MRA. 3. Approximately 70% stenosis of the distal left common carotid artery, just proximal to the bifurcation.      Echocardiogram 12/2022:  1. Left ventricular ejection fraction, by estimation, is 60 to 65%. The  left ventricle has normal function. The left ventricle has no regional  wall motion abnormalities. Left ventricular diastolic parameters are  consistent with Grade I diastolic  dysfunction (impaired relaxation). The average left ventricular global  longitudinal strain is -23.8 %. The global longitudinal strain is normal.   2. Right ventricular systolic function is normal. The right ventricular  size is mildly enlarged.   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 No  evidence of mitral stenosis. There is moderate holosystolic prolapse of  the middle scallop of the posterior leaflet of the mitral valve.   4. The aortic valve is tricuspid. Aortic valve regurgitation is not  visualized. Aortic valve sclerosis/calcification is present, without any  evidence of aortic stenosis.   5. Aortic dilatation noted. There is mild dilatation of the aortic root,  measuring 38 mm. There is mild dilatation of the ascending aorta,  measuring 41  mm.   6. The inferior vena cava is normal in size with greater than 50%  respiratory variability, suggesting right atrial pressure of 3 mmHg.     Zio patch monitor 13 days 01/05/2023 - 01/19/2023: Dominant rhythm: Sinus. HR 55-111 bpm. Avg HR 76 bpm, in sinus rhythm. 12 episodes of SVT/atrial tachycardia, fastest at 150 bpm for 4 beats, longest for 20.3 secs at 101 bpm. 1.5% isolated SVE, <1% couplets./triplets. 1 episode of VT, fastest at 139 bpm for 9 beats. 4.3% isolated VE, <1% couplet/triplets. No atrial fibrillation/atrial flutter/high grade AV block, sinus pause >3sec noted. 2 patient triggered events, correlated with SVE/VE.    Labs 11/2023: Chol 144, TG 86, HDL 57, LDL 71 HbA1C 6.2% TSH 1.2  02/2023: Hb 11.3 Cr 0.9   12/04/2022: Glucose 101, BUN/Cr 13/0.84. EGFR 93. Na/K 137/4.4. Rest of the CMP normal Chol 165, TG 77, HDL 56, LDL 95   Physical Exam:   Physical Exam Vitals and nursing note reviewed.  Constitutional:      General: He is not in acute distress. Neck:     Vascular: No JVD.  Cardiovascular:     Rate and Rhythm: Normal rate and regular rhythm.     Heart sounds: Normal heart sounds. No murmur heard. Pulmonary:     Effort: Pulmonary effort is normal.     Breath sounds: Normal breath sounds. No wheezing or rales.  Musculoskeletal:     Right lower leg: No edema.     Left lower leg: No edema.      VISIT DIAGNOSES:   ICD-10-CM   1. Primary hypertension  I10 EKG 12-Lead    2. Ascending aorta dilatation  I77.810 ECHOCARDIOGRAM COMPLETE    3. Mixed hyperlipidemia  E78.2     4. Coronary artery calcification seen on CAT scan  I25.10     5. Left carotid artery stenosis  I65.22        ASSESSMENT AND PLAN: .    Larry Holloway is a 73 y.o. male with  hypertension, hyperlipidemia, prediabetes, left carotid stenosis, peripheral neuropathy  Hypertension: Elevated blood pressure on first check, improved on subsequent check.  Recent change made  from amlodipine  to amlodipine -valsartan. Continue same and check blood pressure regularly at home. If SBP consistently runs >140, patient will reach out to either me or his PCP.  PAD: Asymptomatic left carotid artery stenosis monitored by Dr. Gari with potential for endarterectomy in the near future. I do not think he needs dual antiplatelet therapy at this time.  Stopped aspirin , continue Plavix  75 mg daily. Lipids very well-controlled on Crestor  40 mg daily.  Coronary calcification: On appropriate medical management with Plavix , statin, blood pressure control.  Ascending aorta dilatation: Borderline 38-41 mm.  Will check echocardiogram now.  Very active, he will not need surveillance monitoring.  Continue follow-up with vascular surgery, I will see him as needed.  Signed, Newman JINNY Lawrence, MD

## 2024-01-31 NOTE — Patient Instructions (Signed)
 Medication Instructions:  STOP Aspirin    *If you need a refill on your cardiac medications before your next appointment, please call your pharmacy*  Testing/Procedures: Echocardiogram  Your physician has requested that you have an echocardiogram. Echocardiography is a painless test that uses sound waves to create images of your heart. It provides your doctor with information about the size and shape of your heart and how well your heart's chambers and valves are working. This procedure takes approximately one hour. There are no restrictions for this procedure. Please do NOT wear cologne, perfume, aftershave, or lotions (deodorant is allowed). Please arrive 15 minutes prior to your appointment time.  Please note: We ask at that you not bring children with you during ultrasound (echo/ vascular) testing. Due to room size and safety concerns, children are not allowed in the ultrasound rooms during exams. Our front office staff cannot provide observation of children in our lobby area while testing is being conducted. An adult accompanying a patient to their appointment will only be allowed in the ultrasound room at the discretion of the ultrasound technician under special circumstances. We apologize for any inconvenience.   Follow-Up: At [facility], you and your health needs are our priority.  As part of our continuing mission to provide you with exceptional heart care, our providers are all part of one team.  This team includes your primary Cardiologist (physician) and Advanced Practice Providers or APPs (Physician Assistants and Nurse Practitioners) who all work together to provide you with the care you need, when you need it.  Your next appointment:   As needed  Provider:   Newman JINNY Lawrence, MD

## 2024-02-09 ENCOUNTER — Encounter (HOSPITAL_COMMUNITY)

## 2024-02-09 ENCOUNTER — Ambulatory Visit

## 2024-02-21 ENCOUNTER — Encounter: Payer: Self-pay | Admitting: Cardiology

## 2024-02-21 DIAGNOSIS — I10 Essential (primary) hypertension: Secondary | ICD-10-CM

## 2024-02-21 NOTE — Telephone Encounter (Signed)
 While there is spread of blood pressure readings, average blood pressure seems to be >SBP 130 mmHg.  For better blood pressure control, we could consider the following:  Patient is currently on amlodipine  5 mg plus amlodipine -valsartan  5-160 mg daily. We could consider increasing valsartan  to 320 mg daily as a separate tablet, and amlodipine  10 mg daily as a separate tablet. If this is acceptable to the patient, please send 2 separate prescriptions and check BMP in 1 week. Future prescriptions can be refilled by his PCP as needed.  Thanks MJP

## 2024-02-21 NOTE — Telephone Encounter (Signed)
 Please review attachment by patient.

## 2024-02-23 MED ORDER — VALSARTAN 320 MG PO TABS: 320.0000 mg | ORAL_TABLET | Freq: Every day | ORAL | 1 refills | Status: AC

## 2024-02-23 MED ORDER — AMLODIPINE BESYLATE 10 MG PO TABS: 10.0000 mg | ORAL_TABLET | Freq: Every day | ORAL | 3 refills | Status: AC

## 2024-03-01 ENCOUNTER — Other Ambulatory Visit: Payer: Self-pay | Admitting: *Deleted

## 2024-03-01 DIAGNOSIS — I10 Essential (primary) hypertension: Secondary | ICD-10-CM

## 2024-03-01 LAB — BASIC METABOLIC PANEL WITH GFR
BUN/Creatinine Ratio: 23 (ref 10–24)
BUN: 20 mg/dL (ref 8–27)
CO2: 23 mmol/L (ref 20–29)
Calcium: 9.6 mg/dL (ref 8.6–10.2)
Chloride: 103 mmol/L (ref 96–106)
Creatinine, Ser: 0.86 mg/dL (ref 0.76–1.27)
Glucose: 95 mg/dL (ref 70–99)
Potassium: 4.8 mmol/L (ref 3.5–5.2)
Sodium: 141 mmol/L (ref 134–144)
eGFR: 91 mL/min/1.73 (ref >59)

## 2024-03-02 ENCOUNTER — Ambulatory Visit: Payer: Self-pay | Admitting: Cardiology

## 2024-03-03 DIAGNOSIS — H348122 Central retinal vein occlusion, left eye, stable: Secondary | ICD-10-CM | POA: Diagnosis not present

## 2024-03-10 ENCOUNTER — Ambulatory Visit: Admitting: Family Medicine

## 2024-03-14 NOTE — Progress Notes (Unsigned)
 Office Note     CC:  follow up Requesting Provider:  Okey Carlin Redbird, MD  HPI: Larry Holloway is a 73 y.o. ([birth year]) male who presents for surveillance follow up of carotid artery stenosis. He has known bilateral ICA stenosis greater on the left. He has a history of left sided visual changes. At time of his last visit in May he did not have any recurrent symptoms. He is not a candidate for stenting due to the highly calcific nature of the lesion. Dr. Sheree previously discussed endarterectomy vs medical management for his stenosis. He elected for medical management.  He returns today for follow up with carotid duplex. He says overall he has been doing very well. He denies any visual changes, slurred speech, facial drooping, unilateral upper or lower extremity weakness or numbness. He recently saw his Cardiologist, Dr. Elmira, and had an increase in his Amlodipine  and ARB due to his blood pressure staying elevated. He says he recently has not been checking it at home. 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 He has one more semester to go. He admits to not being as active as he should. He says he use to run daily but fell out of routine after having neck surgery in 2024. Medically managed on Plavix  and Statin. Was taken off of Aspirin  by Cardiology.   Past Medical History:  Diagnosis Date   Arthritis    Back   Back pain    Cancer of prostate (HCC)    Carotid artery occlusion    Central serous retinopathy    High blood pressure    High cholesterol    Mild depression    Neck pain    PONV (postoperative nausea and vomiting)    Pre-diabetes    Right arm pain     Past Surgical History:  Procedure Laterality Date   ANTERIOR LAT LUMBAR FUSION N/A 08/10/2022   Procedure: Lumbar One-Two, Lumbar Two-Three, Lumbar Three-Four Anterior Lateral Lumbar Fusion;  Surgeon: Larry Shove, MD;  Location: [facility] OR;  Service: Neurosurgery;  Laterality: N/A;   CATARACT EXTRACTION W/  INTRAOCULAR LENS IMPLANT Bilateral 2022   COLONOSCOPY  2023   FRACTURE SURGERY Right 1975   Right Leg   HIP SURGERY Right 2002   Ball and Socket repaired   NECK SURGERY  2014   Cadaver disc placed in neck   PROSTATE SURGERY     2007   TRIGGER FINGER RELEASE Left 12/06/2023   Procedure: RELEASE, A1 PULLEY, FOR TRIGGER FINGER;  Surgeon: Larry Adine BROCKS, MD;  Location: [facility];  Service: Orthopedics;  Laterality: Left;    Social History   Socioeconomic History   Marital status: Married    Spouse name: Not on file   Number of children: Not on file   Years of education: Not on file   Highest education level: Not on file  Occupational History   Not on file  Tobacco Use   Smoking status: Former    Current packs/day: 0.00    Types: Cigarettes    Quit date: 1995    Years since quitting: 30.9   Smokeless tobacco: Never   Tobacco comments:    July 1995  Vaping Use   Vaping status: Never Used  Substance and Sexual Activity   Alcohol use: No    Comment: quit Oct.1996   Drug use: No   Sexual activity: Yes  Other Topics Concern   Not on file  Social History Narrative  Patient lives at home with his wife Larry Holloway . Patient works for himself and work in airline pilot.  Patient has two children.   Social Drivers of Health   Tobacco Use: Medium Risk (03/15/2024)   Patient History    Smoking Tobacco Use: Former    Smokeless Tobacco Use: Never    Passive Exposure: Not on file  Financial Resource Strain: Not on file  Food Insecurity: No Food Insecurity (08/14/2022)   Hunger Vital Sign    Worried About Running Out of Food in the Last Year: Never true    Ran Out of Food in the Last Year: Never true  Transportation Needs: No Transportation Needs (08/14/2022)   PRAPARE - Administrator, Civil Service (Medical): No    Lack of Transportation (Non-Medical): No  Physical Activity: Not on file  Stress: Not on file  Social Connections: Not on file  Intimate Partner  Violence: Not At Risk (08/14/2022)   Humiliation, Afraid, Rape, and Kick questionnaire    Fear of Current or Ex-Partner: No    Emotionally Abused: No    Physically Abused: No    Sexually Abused: No  Depression (PHQ2-9): Not on file  Alcohol Screen: Not on file  Housing: Patient Declined (08/14/2022)   Housing    Last Housing Risk Score: 0  Utilities: Not At Risk (08/14/2022)   AHC Utilities    Threatened with loss of utilities: No  Health Literacy: Not on file    Family History  Problem Relation Age of Onset   Thyroid disease Mother    Heart disease Mother    Cancer Mother    High blood pressure Mother    Larry polyps Mother    Cancer - Lung Mother    Larry polyps Father    CAD Father    Cancer - Prostate Father    High blood pressure Father    Heart disease Father    COPD Father    CVA Father    Cancer - Prostate Brother     Current Outpatient Medications  Medication Sig Dispense Refill   acetaminophen  (TYLENOL ) 500 MG tablet Take 2 tablets (1,000 mg total) by mouth every 6 (six) hours.     ALPRAZolam  (XANAX ) 0.25 MG tablet Take 0.25-0.5 mg by mouth 2 (two) times daily as needed (Restless leg).     amLODipine  (NORVASC ) 10 MG tablet Take 1 tablet (10 mg total) by mouth daily. 180 tablet 3   Ascorbic Acid (VITAMIN C) 1000 MG tablet Take 1,000 mg by mouth daily.     cholecalciferol  (VITAMIN D3) 25 MCG (1000 UNIT) tablet Take 1,000 Units by mouth daily.     clopidogrel  (PLAVIX ) 75 MG tablet Take 75 mg by mouth daily.     DULoxetine  (CYMBALTA ) 60 MG capsule Take 60 mg by mouth daily.     ferrous sulfate 325 (65 FE) MG tablet Take 325 mg by mouth daily with breakfast.     L-Methylfolate 15 MG TABS Take 7.5 mg by mouth daily.     loratadine  (CLARITIN ) 10 MG tablet Take 10 mg by mouth daily as needed for allergies.     Multiple Vitamins-Minerals (MULTIVITAMIN PO) Take 1 tablet by mouth daily. One a day     omeprazole (PRILOSEC) 20 MG capsule Take 20 mg by mouth daily.      ondansetron  (ZOFRAN -ODT) 4 MG disintegrating tablet Take 1 tablet (4 mg total) by mouth every 8 (eight) hours as needed for nausea or vomiting. (Patient not taking: Reported  on 01/31/2024) 20 tablet 0   pramipexole  (MIRAPEX ) 0.5 MG tablet Take 0.5 mg by mouth 2 (two) times daily.     rosuvastatin  (CRESTOR ) 40 MG tablet TAKE 1 TABLET(40 MG) BY MOUTH DAILY 90 tablet 1   valsartan  (DIOVAN ) 320 MG tablet Take 1 tablet (320 mg total) by mouth daily. 90 tablet 1   No current facility-administered medications for this visit.    Allergies[1]   REVIEW OF SYSTEMS:   [X]  denotes positive finding, [ ]  denotes negative finding Cardiac  Comments:  Chest pain or chest pressure:    Shortness of breath upon exertion:    Short of breath when lying flat:    Irregular heart rhythm:        Vascular    Pain in calf, thigh, or hip brought on by ambulation:    Pain in feet at night that wakes you up from your sleep:     Blood clot in your veins:    Leg swelling:         Pulmonary    Oxygen at home:    Productive cough:     Wheezing:         Neurologic    Sudden weakness in arms or legs:     Sudden numbness in arms or legs:     Sudden onset of difficulty speaking or slurred speech:    Temporary loss of vision in one eye:     Problems with dizziness:         Gastrointestinal    Blood in stool:     Vomited blood:         Genitourinary    Burning when urinating:     Blood in urine:        Psychiatric    Major depression:         Hematologic    Bleeding problems:    Problems with blood clotting too easily:        Skin    Rashes or ulcers:        Constitutional    Fever or chills:      PHYSICAL EXAMINATION:  Vitals:   03/15/24 1119 03/15/24 1122 03/15/24 1123  BP: (!) 158/77 138/77 (!) 153/80  Pulse: 62 64 64  Temp: 98.1 F (36.7 C)    TempSrc: Temporal    Weight: 207 lb 14.4 oz (94.3 kg)      General:  WDWN in NAD; vital signs documented above Gait: Normal HENT: WNL,  normocephalic Pulmonary: normal non-labored breathing Cardiac: regular HR, without  Murmurs without carotid bruit Abdomen: soft Vascular Exam/Pulses: 2+ radial pulses, feet warm and well perfused; moving all extremities without deficits  Musculoskeletal: no muscle wasting or atrophy  Neurologic: A&O X 3 Psychiatric:  The pt has Normal affect.   Non-Invasive Vascular Imaging:   VAS US  Carotid duplex: Summary:  Right Carotid: Velocities in the right ICA are consistent with a 1-39% stenosis. The ECA appears >50% stenosed.   Left Carotid: Velocities in the left ICA are consistent with a 80-99% stenosis. The ECA appears >50% stenosed.   Vertebrals:  Bilateral vertebral arteries demonstrate antegrade flow.  Subclavians: Normal flow hemodynamics were seen in bilateral subclavian arteries.    ASSESSMENT/PLAN:: 73 y.o. male here for follow up for carotid artery stenosis. He has known left ICA stenosis. This has been in the 60-79% range by velocity and on CTA. He has been medically managed thus far to reduce his stroke risk. He currently is without any  TIA or stroke like symptoms.  - Duplex today shows increased velocities with in the left ICA. Based on duplex today his left ICA is >80% with EDV of 143 cm/s. Previously 76 cm/s in January. Right ICA remains 1-39% by duplex. Normal flow in the vertebral and subclavian arteries. - Based on the increased velocities on duplex today with > 80% stenosis. I have recommended CTA neck evaluation and follow up with Dr. Sheree to evaluate for elective endarterectomy  - He will continue his Statin and Plavix  - We discussed importance of blood pressure control. He will follow up with Dr. Elmira regarding his antihypertensive regimen - We reviewed signs and symptoms of TIA/ Stroke and he understands to seek immediate medical attention should these occur - He will follow up with Dr. Sheree after CTA neck in the next several weeks   Teretha Damme, PA-C [facility] [phone]  Clinic MD:   Larry Holloway     [1]  Allergies Allergen Reactions   Ace Inhibitors Cough   Corticosteroids     Other Reaction(s): Central seroud retinopathy   Tape Swelling   Wound Dressing Adhesive Swelling

## 2024-03-15 ENCOUNTER — Ambulatory Visit

## 2024-03-15 ENCOUNTER — Ambulatory Visit (HOSPITAL_BASED_OUTPATIENT_CLINIC_OR_DEPARTMENT_OTHER)
Admission: RE | Admit: 2024-03-15 | Discharge: 2024-03-15 | Disposition: A | Discharge status: Home / Self Care | Source: Ambulatory Visit | Attending: Cardiology | Admitting: Cardiology

## 2024-03-15 ENCOUNTER — Ambulatory Visit (HOSPITAL_COMMUNITY): Admission: RE | Admit: 2024-03-15 | Discharge: 2024-03-15 | Attending: Vascular Surgery

## 2024-03-15 VITALS — BP 153/80 | HR 64 | Temp 98.1°F | Wt 207.9 lb

## 2024-03-15 DIAGNOSIS — I77810 Thoracic aortic ectasia: Secondary | ICD-10-CM | POA: Insufficient documentation

## 2024-03-15 DIAGNOSIS — I6522 Occlusion and stenosis of left carotid artery: Secondary | ICD-10-CM | POA: Diagnosis not present

## 2024-03-15 DIAGNOSIS — Z79899 Other long term (current) drug therapy: Secondary | ICD-10-CM | POA: Diagnosis not present

## 2024-03-15 DIAGNOSIS — Z9181 History of falling: Secondary | ICD-10-CM | POA: Insufficient documentation

## 2024-03-15 DIAGNOSIS — Z859 Personal history of malignant neoplasm, unspecified: Secondary | ICD-10-CM | POA: Insufficient documentation

## 2024-03-15 DIAGNOSIS — I10 Essential (primary) hypertension: Secondary | ICD-10-CM | POA: Insufficient documentation

## 2024-03-15 DIAGNOSIS — Z7902 Long term (current) use of antithrombotics/antiplatelets: Secondary | ICD-10-CM | POA: Insufficient documentation

## 2024-03-15 DIAGNOSIS — E785 Hyperlipidemia, unspecified: Secondary | ICD-10-CM | POA: Diagnosis not present

## 2024-03-15 LAB — ECHOCARDIOGRAM COMPLETE
Area-P 1/2: 3.68 cm2
S' Lateral: 3.2 cm

## 2024-03-16 ENCOUNTER — Ambulatory Visit: Payer: Self-pay | Admitting: Cardiology

## 2024-03-16 ENCOUNTER — Other Ambulatory Visit: Payer: Self-pay | Admitting: *Deleted

## 2024-03-16 DIAGNOSIS — I77810 Thoracic aortic ectasia: Secondary | ICD-10-CM

## 2024-03-16 DIAGNOSIS — I6522 Occlusion and stenosis of left carotid artery: Secondary | ICD-10-CM

## 2024-03-16 NOTE — Progress Notes (Signed)
 Normal heart function.  Mild leakiness of mitral valve and moderate enlargement of top chamber of the heart, not severe abnormalities.  Aortic root is stable and likely upper limit normal.  Recommend repeat echocardiogram in 1 year to monitor mitral regurgitation.  Thanks MJP

## 2024-03-21 ENCOUNTER — Ambulatory Visit (HOSPITAL_COMMUNITY)

## 2024-03-28 ENCOUNTER — Ambulatory Visit (HOSPITAL_COMMUNITY)
Admission: RE | Admit: 2024-03-28 | Discharge: 2024-03-28 | Disposition: A | Discharge status: Home / Self Care | Source: Ambulatory Visit | Attending: Vascular Surgery | Admitting: Vascular Surgery

## 2024-03-28 DIAGNOSIS — I6522 Occlusion and stenosis of left carotid artery: Secondary | ICD-10-CM | POA: Insufficient documentation

## 2024-03-28 MED ORDER — IOHEXOL 350 MG/ML SOLN
75.0000 mL | Freq: Once | INTRAVENOUS | Status: AC | PRN
  Administered 2024-03-28: 75 mL via INTRAVENOUS

## 2024-04-05 ENCOUNTER — Encounter: Payer: Self-pay | Admitting: Vascular Surgery

## 2024-04-05 ENCOUNTER — Ambulatory Visit: Attending: Vascular Surgery | Admitting: Vascular Surgery

## 2024-04-05 VITALS — BP 144/79 | HR 74 | Temp 98.0°F | Ht 71.0 in | Wt 211.0 lb

## 2024-04-05 DIAGNOSIS — I6522 Occlusion and stenosis of left carotid artery: Secondary | ICD-10-CM

## 2024-04-05 NOTE — Progress Notes (Signed)
 "  Patient ID: Larry Holloway, male   DOB: 1950/10/10, 74 y.o.   MRN: [id number]  Reason for Consult: Follow-up   Referred by Okey Carlin Redbird, MD  Subjective:     HPI:  Larry Holloway is a 74 y.o. male has a history of known bilateral carotid artery stenosis left greater than right with a history of left sided visual changes and a history of a clavicular fracture while on Plavix  with significant bruising.  He was previously on aspirin  as well now on Plavix  and statin alone.  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  He denies any new neurologic symptoms.  Past Medical History:  Diagnosis Date   Arthritis    Back   Back pain    Cancer of prostate (HCC)    Carotid artery occlusion    Central serous retinopathy    High blood pressure    High cholesterol    Mild depression    Neck pain    PONV (postoperative nausea and vomiting)    Pre-diabetes    Right arm pain    Family History  Problem Relation Age of Onset   Thyroid disease Mother    Heart disease Mother    Cancer Mother    High blood pressure Mother    Colon polyps Mother    Cancer - Lung Mother    Colon polyps Father    CAD Father    Cancer - Prostate Father    High blood pressure Father    Heart disease Father    COPD Father    CVA Father    Cancer - Prostate Brother    Past Surgical History:  Procedure Laterality Date   ANTERIOR LAT LUMBAR FUSION N/A 08/10/2022   Procedure: Lumbar One-Two, Lumbar Two-Three, Lumbar Three-Four Anterior Lateral Lumbar Fusion;  Surgeon: Colon Shove, MD;  Location: [facility] OR;  Service: Neurosurgery;  Laterality: N/A;   CATARACT EXTRACTION W/ INTRAOCULAR LENS IMPLANT Bilateral 2022   COLONOSCOPY  2023   FRACTURE SURGERY Right 1975   Right Leg   HIP SURGERY Right 2002   Ball and Socket repaired   NECK SURGERY  2014   Cadaver disc placed in neck   PROSTATE SURGERY     2007   TRIGGER FINGER RELEASE Left 12/06/2023   Procedure: RELEASE, A1 PULLEY, FOR  TRIGGER FINGER;  Surgeon: Sherida Adine BROCKS, MD;  Location: [facility];  Service: Orthopedics;  Laterality: Left;    Short Social History:  Social History   Tobacco Use   Smoking status: Former    Current packs/day: 0.00    Types: Cigarettes    Quit date: 1995    Years since quitting: 31.0   Smokeless tobacco: Never   Tobacco comments:    July 1995  Substance Use Topics   Alcohol use: No    Comment: quit Oct.1996    Allergies[1]  Current Outpatient Medications  Medication Sig Dispense Refill   acetaminophen  (TYLENOL ) 500 MG tablet Take 2 tablets (1,000 mg total) by mouth every 6 (six) hours.     ALPRAZolam  (XANAX ) 0.25 MG tablet Take 0.25-0.5 mg by mouth 2 (two) times daily as needed (Restless leg).     amLODipine  (NORVASC ) 10 MG tablet Take 1 tablet (10 mg total) by mouth daily. 180 tablet 3   Ascorbic Acid (VITAMIN C) 1000 MG tablet Take 1,000 mg by mouth daily.     cholecalciferol  (VITAMIN D3) 25 MCG (1000 UNIT) tablet Take 1,000  Units by mouth daily.     clopidogrel  (PLAVIX ) 75 MG tablet Take 75 mg by mouth daily.     DULoxetine  (CYMBALTA ) 60 MG capsule Take 60 mg by mouth daily.     ferrous sulfate 325 (65 FE) MG tablet Take 325 mg by mouth daily with breakfast.     L-Methylfolate 15 MG TABS Take 7.5 mg by mouth daily.     loratadine  (CLARITIN ) 10 MG tablet Take 10 mg by mouth daily as needed for allergies.     Multiple Vitamins-Minerals (MULTIVITAMIN PO) Take 1 tablet by mouth daily. One a day     omeprazole (PRILOSEC) 20 MG capsule Take 20 mg by mouth daily.     ondansetron  (ZOFRAN -ODT) 4 MG disintegrating tablet Take 1 tablet (4 mg total) by mouth every 8 (eight) hours as needed for nausea or vomiting. 20 tablet 0   pramipexole  (MIRAPEX ) 0.5 MG tablet Take 0.5 mg by mouth 2 (two) times daily.     rosuvastatin  (CRESTOR ) 40 MG tablet TAKE 1 TABLET(40 MG) BY MOUTH DAILY 90 tablet 1   valsartan  (DIOVAN ) 320 MG tablet Take 1 tablet (320 mg total) by mouth daily.  90 tablet 1   No current facility-administered medications for this visit.    Review of Systems  Constitutional:  Constitutional negative. HENT: HENT negative.  Eyes: Eyes negative.  Respiratory: Respiratory negative.  Cardiovascular: Cardiovascular negative.  GI: Gastrointestinal negative.  Musculoskeletal: Musculoskeletal negative.  Skin: Skin negative.  Neurological: Neurological negative. Hematologic: Negative for bruises/bleeds easily. Psychiatric: Psychiatric negative.        Objective:  Objective   Vitals:   04/05/24 0836 04/05/24 0838  BP: (!) 144/84 (!) 144/79  Pulse: 74   Temp: 98 F (36.7 C)   SpO2: 96%      Physical Exam HENT:     Head: Normocephalic.     Mouth/Throat:     Mouth: Mucous membranes are moist.  Eyes:     Pupils: Pupils are equal, round, and reactive to light.  Abdominal:     General: Abdomen is flat.  Musculoskeletal:        General: Normal range of motion.     Cervical back: Normal range of motion and neck supple.     Right lower leg: No edema.     Left lower leg: No edema.  Skin:    General: Skin is warm.     Capillary Refill: Capillary refill takes less than 2 seconds.  Neurological:     General: No focal deficit present.  Psychiatric:        Mood and Affect: Mood normal.     Data: Right Carotid Findings:  +----------+--------+--------+--------+-------------------------+--------+           PSV cm/sEDV cm/sStenosisPlaque Description       Comments  +----------+--------+--------+--------+-------------------------+--------+  CCA Prox  135     19                                                 +----------+--------+--------+--------+-------------------------+--------+  CCA Mid   119     22                                                 +----------+--------+--------+--------+-------------------------+--------+  CCA Distal135     29              heterogenous                        +----------+--------+--------+--------+-------------------------+--------+  ICA Prox  171     19      1-39%   calcific and heterogenous          +----------+--------+--------+--------+-------------------------+--------+  ICA Mid   69      22                                                 +----------+--------+--------+--------+-------------------------+--------+  ICA Distal64      22                                                 +----------+--------+--------+--------+-------------------------+--------+  ECA      308     31      >50%                                       +----------+--------+--------+--------+-------------------------+--------+   +----------+--------+-------+----------------+-------------------+           PSV cm/sEDV cmsDescribe        Arm Pressure (mmHG)  +----------+--------+-------+----------------+-------------------+  [id number]     1      Multiphasic, [id number]                  +----------+--------+-------+----------------+-------------------+   +---------+--------+--+--------+--+---------+  VertebralPSV cm/s73EDV cm/s19Antegrade  +---------+--------+--+--------+--+---------+      Left Carotid Findings:  +----------+--------+--------+--------+------------------+--------+           PSV cm/sEDV cm/sStenosisPlaque DescriptionComments  +----------+--------+--------+--------+------------------+--------+  CCA Prox  50      13              heterogenous                +----------+--------+--------+--------+------------------+--------+  CCA Mid   76      19              heterogenous                +----------+--------+--------+--------+------------------+--------+  CCA Distal285     94      >50%    calcific                    +----------+--------+--------+--------+------------------+--------+  ICA Prox  424     143     80-99%  calcific                     +----------+--------+--------+--------+------------------+--------+  ICA Mid   59      22                                          +----------+--------+--------+--------+------------------+--------+  ICA Distal46      17                                          +----------+--------+--------+--------+------------------+--------+  ECA      358     86      >50%                                +----------+--------+--------+--------+------------------+--------+   +----------+--------+--------+----------------+-------------------+           PSV cm/sEDV cm/sDescribe        Arm Pressure (mmHG)  +----------+--------+--------+----------------+-------------------+  [id number]    7       Multiphasic, [id number]                  +----------+--------+--------+----------------+-------------------+   +---------+--------+--+--------+--+---------+  VertebralPSV cm/s64EDV cm/s13Antegrade  +---------+--------+--+--------+--+---------+         Summary:  Right Carotid: Velocities in the right ICA are consistent with a 1-39%  stenosis.                The ECA appears >50% stenosed.   Left Carotid: Velocities in the left ICA are consistent with a 80-99%  stenosis.               The ECA appears >50% stenosed.   Vertebrals:  Bilateral vertebral arteries demonstrate antegrade flow.  Subclavians: Normal flow hemodynamics were seen in bilateral subclavian               arteries.    CTA reviewed with patient, formal read pending     Assessment/Plan:     74 year old male with now asymptomatic high-grade left ICA stenosis appears to have a tandem lesion then a left common carotid artery.  We discussed his options being continued medical therapy versus carotid endarterectomy as we have previously discussed that he is not a stent candidate due to the highly calcific nature of the lesion and would have access complications for a TCAR as well.  We have discussed all these  options in detail as well as the risk and benefits and he is elected for carotid endarterectomy and he is hopeful to have this performed during his spring break in early March which should be sufficient timeframe.  He will continue Plavix  perioperatively.  We discussed the risk benefits alternatives of the surgical procedure itself and he demonstrates good understanding.  We have also discussed the need to seek emergent medical attention with any strokelike symptoms.  All questions were answered.     Penne Lonni Colorado MD [facility]       [1]  Allergies Allergen Reactions   Ace Inhibitors Cough   Corticosteroids     Other Reaction(s): Central seroud retinopathy   Tape Swelling   Wound Dressing Adhesive Swelling   "

## 2024-04-07 ENCOUNTER — Encounter: Payer: Self-pay | Admitting: Vascular Surgery

## 2024-04-17 ENCOUNTER — Telehealth: Payer: Self-pay | Admitting: Cardiology

## 2024-04-17 ENCOUNTER — Other Ambulatory Visit: Payer: Self-pay | Admitting: Adult Health

## 2024-04-17 ENCOUNTER — Telehealth (HOSPITAL_BASED_OUTPATIENT_CLINIC_OR_DEPARTMENT_OTHER): Payer: Self-pay | Admitting: *Deleted

## 2024-04-17 NOTE — Telephone Encounter (Signed)
 Awaiting clearance request form fax.

## 2024-04-17 NOTE — Telephone Encounter (Signed)
 Patient scheduled for pre-op clearance on 04/21/24 with Barnie Loistine PIETY.     Patient Consent for Virtual Visit        Larry Holloway has provided verbal consent on 04/17/2024 for a virtual visit (video or telephone).   CONSENT FOR VIRTUAL VISIT FOR:  Larry Holloway  By participating in this virtual visit I agree to the following:  I hereby voluntarily request, consent and authorize [facility] and its employed or contracted physicians, physician assistants, nurse practitioners or other licensed health care professionals (the Practitioner), to provide me with telemedicine health care services (the Services) as deemed necessary by the treating Practitioner. I acknowledge and consent to receive the Services by the Practitioner via telemedicine. I understand that the telemedicine visit will involve communicating with the Practitioner through live audiovisual communication technology and the disclosure of certain medical information by electronic transmission. I acknowledge that I have been given the opportunity to request an in-person assessment or other available alternative prior to the telemedicine visit and am voluntarily participating in the telemedicine visit.  I understand that I have the right to withhold or withdraw my consent to the use of telemedicine in the course of my care at any time, without affecting my right to future care or treatment, and that the Practitioner or I may terminate the telemedicine visit at any time. I understand that I have the right to inspect all information obtained and/or recorded in the course of the telemedicine visit and may receive copies of available information for a reasonable fee.  I understand that some of the potential risks of receiving the Services via telemedicine include:  Delay or interruption in medical evaluation due to technological equipment failure or disruption; Information transmitted may not be sufficient (e.g. poor  resolution of images) to allow for appropriate medical decision making by the Practitioner; and/or  In rare instances, security protocols could fail, causing a breach of personal health information.  Furthermore, I acknowledge that it is my responsibility to provide information about my medical history, conditions and care that is complete and accurate to the best of my ability. I acknowledge that Practitioner's advice, recommendations, and/or decision may be based on factors not within their control, such as incomplete or inaccurate data provided by me or distortions of diagnostic images or specimens that may result from electronic transmissions. I understand that the practice of medicine is not an exact science and that Practitioner makes no warranties or guarantees regarding treatment outcomes. I acknowledge that a copy of this consent can be made available to me via my patient portal [facility] MyChart), or I can request a printed copy by calling the office of [facility].    I understand that my insurance will be billed for this visit.   I have read or had this consent read to me. I understand the contents of this consent, which adequately explains the benefits and risks of the Services being provided via telemedicine.  I have been provided ample opportunity to ask questions regarding this consent and the Services and have had my questions answered to my satisfaction. I give my informed consent for the services to be provided through the use of telemedicine in my medical care

## 2024-04-17 NOTE — Telephone Encounter (Signed)
" ° °  Name: Larry Holloway  DOB: 25-Feb-1951  MRN: [id number]  Primary Cardiologist: Newman JINNY Rya Rausch, MD   Preoperative team, please contact this patient and set up a phone call appointment for further preoperative risk assessment. Please obtain consent and complete medication review. Thank you for your help.  I confirm that guidance regarding antiplatelet and oral anticoagulation therapy has been completed and, if necessary, noted below.  Per office protocol, if patient is without any new symptoms or concerns at the time of their virtual visit, he/she may hold Plavix  for 5 days prior to procedure. Please resume Plavix  as soon as possible postprocedure, at the discretion of the surgeon.    I also confirmed the patient resides in the state of [place] . As per [facility] Medical Board telemedicine laws, the patient must reside in the state in which the provider is licensed.   Lamarr Satterfield, NP 04/17/2024, 11:04 AM [facility]    "

## 2024-04-17 NOTE — Telephone Encounter (Signed)
 Office was calling about clearance for patient, advise office we dont have one on file for patient. Office is going to fax one over. Please advise

## 2024-04-17 NOTE — Telephone Encounter (Signed)
"  ° °  Pre-operative Risk Assessment    Patient Name: Larry Holloway  DOB: 1950-11-15 MRN: [id number]   Date of last office visit: 01/31/2024 Date of next office visit: None  Request for Surgical Clearance    Procedure:  Left carotid endarterectomy  Date of Surgery:  Clearance TBD                                 Surgeon:  Dr. Penne [place] Surgeon's Group or Practice Name:  [facility] Phone number:  [phone] Fax number:  [phone]   Type of Clearance Requested:   - Medical  - Pharmacy:  Hold Clopidogrel  (Plavix ) Not Indicated   Type of Anesthesia:  General    Additional requests/questions:    Signed, Edsel Grayce Sanders   04/17/2024, 10:53 AM   "

## 2024-04-17 NOTE — Telephone Encounter (Signed)
" ° °  Name: Larry Holloway  DOB: [birth year]  MRN: [id number]  Primary Cardiologist: Newman JINNY Khamari Sheehan, MD   Preoperative team, please contact this patient and set up a phone call appointment for further preoperative risk assessment. Please obtain consent and complete medication review. Thank you for your help.  I confirm that guidance regarding antiplatelet and oral anticoagulation therapy has been completed and, if necessary, noted below. Per office protocol, if patient is without any new symptoms or concerns at the time of their virtual visit, he/she may hold Plavix  for 5 days prior to procedure. Please resume Plavix  as soon as possible postprocedure, at the discretion of the surgeon.    I also confirmed the patient resides in the state of [place] . As per [facility] Medical Board telemedicine laws, the patient must reside in the state in which the provider is licensed.   Lamarr Satterfield, NP 04/17/2024, 11:07 AM [facility]    "

## 2024-04-21 ENCOUNTER — Ambulatory Visit: Attending: Cardiovascular Disease

## 2024-04-21 DIAGNOSIS — Z01818 Encounter for other preprocedural examination: Secondary | ICD-10-CM

## 2024-04-21 DIAGNOSIS — Z01810 Encounter for preprocedural cardiovascular examination: Secondary | ICD-10-CM

## 2024-04-21 NOTE — Progress Notes (Signed)
 "   Virtual Visit via Telephone Note   Because of Larry Holloway co-morbid illnesses, he is at least at moderate risk for complications without adequate follow up.  This format is felt to be most appropriate for this patient at this time.  Due to technical limitations with video connection (technology), today's appointment will be conducted as an audio only telehealth visit, and Larry Holloway verbally agreed to proceed in this manner.   All issues noted in this document were discussed and addressed.  No physical exam could be performed with this format.  Evaluation Performed:  Preoperative cardiovascular risk assessment _____________   Date:  04/21/2024   Patient ID:  Larry Holloway, DOB [birth year], MRN [id number] Patient Location:  Home Provider location:   Office  Primary Care Provider:  Okey Carlin Redbird, MD Primary Cardiologist:  Larry JINNY Lawrence, MD  Chief Complaint / Patient Profile   74 y.o. y/o male with a h/o ascending aorta dilation, coronary artery calcification seen on CT, left carotid artery stenosis, hyperlipidemia, hypertension, and prostate cancer who is pending left carotid endarterectomy and presents today for telephonic preoperative cardiovascular risk assessment.  History of Present Illness    Larry Holloway is a 74 y.o. male who presents via audio/video conferencing for a telehealth visit today.  Pt was last seen in cardiology clinic on 01/31/2024 by Dr. Lawrence.  At that time Larry Holloway was doing well.  The patient is now pending procedure as outlined above. Since his last visit, he goes to Tai Chi once a week. 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  He denies chest pain, shortness of breath, lower extremity edema, fatigue, palpitations, melena, hematuria, hemoptysis, diaphoresis, weakness, presyncope, syncope, orthopnea, and PND.  Past Medical History    Past Medical History:  Diagnosis Date    Arthritis    Back   Back pain    Cancer of prostate (HCC)    Carotid artery occlusion    Central serous retinopathy    High blood pressure    High cholesterol    Mild depression    Neck pain    PONV (postoperative nausea and vomiting)    Pre-diabetes    Right arm pain    Past Surgical History:  Procedure Laterality Date   ANTERIOR LAT LUMBAR FUSION N/A 08/10/2022   Procedure: Lumbar One-Two, Lumbar Two-Three, Lumbar Three-Four Anterior Lateral Lumbar Fusion;  Surgeon: Colon Shove, MD;  Location: [facility] OR;  Service: Neurosurgery;  Laterality: N/A;   CATARACT EXTRACTION W/ INTRAOCULAR LENS IMPLANT Bilateral 2022   COLONOSCOPY  2023   FRACTURE SURGERY Right 1975   Right Leg   HIP SURGERY Right 2002   Ball and Socket repaired   NECK SURGERY  2014   Cadaver disc placed in neck   PROSTATE SURGERY     2007   TRIGGER FINGER RELEASE Left 12/06/2023   Procedure: RELEASE, A1 PULLEY, FOR TRIGGER FINGER;  Surgeon: Sherida Adine BROCKS, MD;  Location: [facility];  Service: Orthopedics;  Laterality: Left;    Allergies  Allergies[1]  Home Medications    Prior to Admission medications  Medication Sig Start Date End Date Taking? Authorizing Provider  acetaminophen  (TYLENOL ) 500 MG tablet Take 2 tablets (1,000 mg total) by mouth every 6 (six) hours. 03/29/23   Augustus Almarie RAMAN, PA-C  ALPRAZolam  (XANAX ) 0.25 MG tablet Take 0.25-0.5 mg by mouth 2 (two) times daily as needed (Restless leg).  [provider]  amLODipine  (NORVASC ) 10 MG tablet Take 1 tablet (10 mg total) by mouth daily. 02/23/24 05/23/24  Patwardhan, Larry PARAS, MD  Ascorbic Acid (VITAMIN C) 1000 MG tablet Take 1,000 mg by mouth daily.    [provider]  cholecalciferol  (VITAMIN D3) 25 MCG (1000 UNIT) tablet Take 1,000 Units by mouth daily.    [provider]  clopidogrel  (PLAVIX ) 75 MG tablet Take 75 mg by mouth daily.    [provider]  DULoxetine  (CYMBALTA ) 60 MG capsule Take 60  mg by mouth daily.    [provider]  ferrous sulfate 325 (65 FE) MG tablet Take 325 mg by mouth daily with breakfast.    [provider]  L-Methylfolate 15 MG TABS Take 7.5 mg by mouth daily. 06/23/22   [provider]  loratadine  (CLARITIN ) 10 MG tablet Take 10 mg by mouth daily as needed for allergies.    [provider]  Multiple Vitamins-Minerals (MULTIVITAMIN PO) Take 1 tablet by mouth daily. One a day    [provider]  omeprazole (PRILOSEC) 20 MG capsule Take 20 mg by mouth daily.    [provider]  ondansetron  (ZOFRAN -ODT) 4 MG disintegrating tablet Take 1 tablet (4 mg total) by mouth every 8 (eight) hours as needed for nausea or vomiting. 12/06/23   Sherida Adine BROCKS, MD  pramipexole  (MIRAPEX ) 0.5 MG tablet Take 0.5 mg by mouth 2 (two) times daily. 05/06/22   [provider]  rosuvastatin  (CRESTOR ) 40 MG tablet TAKE 1 TABLET(40 MG) BY MOUTH DAILY 08/24/23   Patwardhan, Manish J, MD  valsartan  (DIOVAN ) 320 MG tablet Take 1 tablet (320 mg total) by mouth daily. 02/23/24   [place] Larry PARAS, MD    Physical Exam    Vital Signs:  Larry Holloway does not have vital signs available for review today.  Given telephonic nature of communication, physical exam is limited. AAOx3. NAD. Normal affect.  Speech and respirations are unlabored.  Accessory Clinical Findings    None  Assessment & Plan    1.  Preoperative Cardiovascular Risk Assessment: According to the Revised Cardiac Risk Index (RCRI), his Perioperative Risk of Major Cardiac Event is (%): 0.4 His Functional Capacity in METs is: 4.64 according to the [facility] Activity Status Index (DASI). Per AHA/ACC guidelines, he is deemed acceptable risk for the planned procedure without additional cardiovascular testing. Will route to surgical team so they are aware.   The patient was advised that if he develops new symptoms prior to surgery to contact our office to arrange for a  follow-up visit, and he verbalized understanding.  Per Dr Sheree note 04/05/2024 He will continue Plavix  perioperatively.   A copy of this note will be routed to requesting surgeon.  Time:   Today, I have spent 10 minutes with the patient with telehealth technology discussing medical history, symptoms, and management plan.     Mardy KATHEE Pizza, FNP  04/21/2024, 10:25 AM     [1]  Allergies Allergen Reactions   Ace Inhibitors Cough   Corticosteroids     Other Reaction(s): Central seroud retinopathy   Tape Swelling   Wound Dressing Adhesive Swelling   "

## 2024-04-27 ENCOUNTER — Other Ambulatory Visit: Payer: Self-pay

## 2024-04-27 DIAGNOSIS — I6522 Occlusion and stenosis of left carotid artery: Secondary | ICD-10-CM

## 2024-06-02 ENCOUNTER — Inpatient Hospital Stay (HOSPITAL_COMMUNITY): Admit: 2024-06-02 | Admitting: Vascular Surgery
